# Patient Record
Sex: Female | Born: 1980 | Race: Black or African American | Hispanic: No | Marital: Married | Smoking: Never smoker
Health system: Southern US, Community
[De-identification: ages and names within clinical notes are randomized; demographics above are authoritative.]

## PROBLEM LIST (undated history)

## (undated) ENCOUNTER — Inpatient Hospital Stay (HOSPITAL_COMMUNITY): Payer: Self-pay

## (undated) DIAGNOSIS — R51 Headache: Secondary | ICD-10-CM

## (undated) DIAGNOSIS — O26872 Cervical shortening, second trimester: Secondary | ICD-10-CM

## (undated) DIAGNOSIS — I10 Essential (primary) hypertension: Secondary | ICD-10-CM

## (undated) DIAGNOSIS — O3432 Maternal care for cervical incompetence, second trimester: Secondary | ICD-10-CM

## (undated) DIAGNOSIS — Z5189 Encounter for other specified aftercare: Secondary | ICD-10-CM

## (undated) HISTORY — DX: Headache: R51

## (undated) HISTORY — DX: Encounter for other specified aftercare: Z51.89

## (undated) HISTORY — PX: NO PAST SURGERIES: SHX2092

## (undated) HISTORY — DX: Essential (primary) hypertension: I10

---
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
## 2003-10-12 ENCOUNTER — Emergency Department (HOSPITAL_COMMUNITY): Admission: EM | Admit: 2003-10-12 | Discharge: 2003-10-12 | Payer: Self-pay | Admitting: Emergency Medicine

## 2005-03-23 ENCOUNTER — Emergency Department (HOSPITAL_COMMUNITY): Admission: EM | Admit: 2005-03-23 | Discharge: 2005-03-23 | Payer: Self-pay | Admitting: Emergency Medicine

## 2006-08-16 ENCOUNTER — Ambulatory Visit: Payer: Self-pay | Admitting: Gynecology

## 2008-01-16 ENCOUNTER — Observation Stay (HOSPITAL_COMMUNITY): Admission: AD | Admit: 2008-01-16 | Discharge: 2008-01-17 | Payer: Self-pay | Admitting: Obstetrics and Gynecology

## 2009-02-28 ENCOUNTER — Emergency Department (HOSPITAL_BASED_OUTPATIENT_CLINIC_OR_DEPARTMENT_OTHER): Admission: EM | Admit: 2009-02-28 | Discharge: 2009-03-01 | Payer: Self-pay | Admitting: Emergency Medicine

## 2009-03-01 ENCOUNTER — Ambulatory Visit: Payer: Self-pay | Admitting: Diagnostic Radiology

## 2009-03-04 ENCOUNTER — Inpatient Hospital Stay (HOSPITAL_COMMUNITY): Admission: AD | Admit: 2009-03-04 | Discharge: 2009-03-04 | Payer: Self-pay | Admitting: Obstetrics and Gynecology

## 2010-06-12 IMAGING — CT CT ABDOMEN W/ CM
2 of 4 series · 16 of 46 positions shown, 18 images · IV contrast (APPLIED)
Comparison: None.

CT ABDOMEN

CLINICAL DATA: Abdominal pain.  Nausea and vomiting.

CT ABDOMEN AND PELVIS WITH CONTRAST  03/01/2009:
TECHNIQUE: Multidetector CT imaging of the abdomen and pelvis was
performed using the standard protocol following bolus
administration of intravenous contrast.
Contrast: 100 ml [id number] IV.  Water as oral contrast.

[Series 3: abd/pelvis 5.0 b31f · axial · 0.71mm/px · z∈[-452,-7]mm · 13 of 99 slices shown, 15 images]
[im 5/99  soft-tissue]
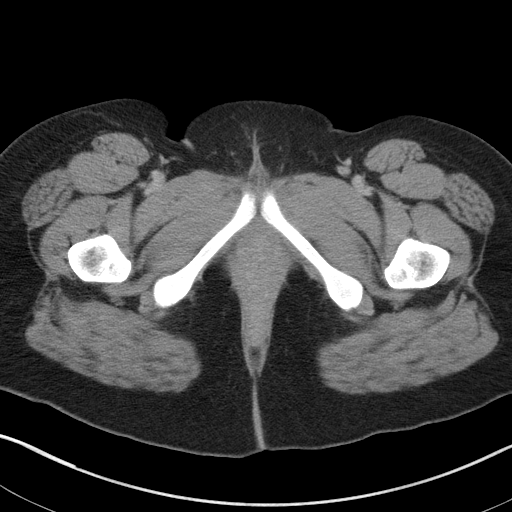
[im 5/99  bone]
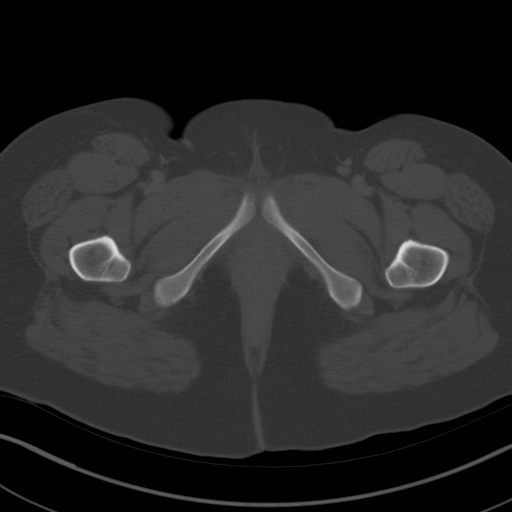
[im 13/99  soft-tissue]
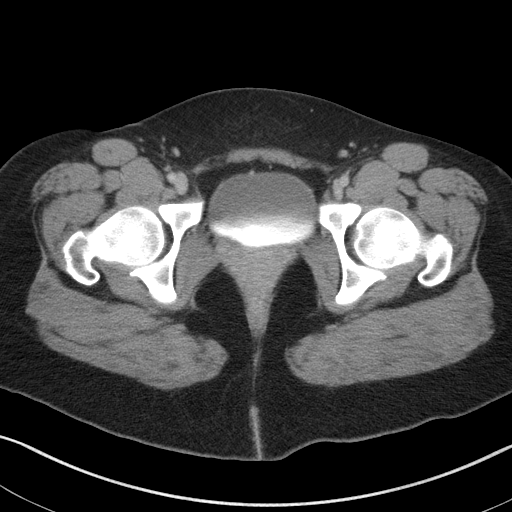
[im 22/99  soft-tissue]
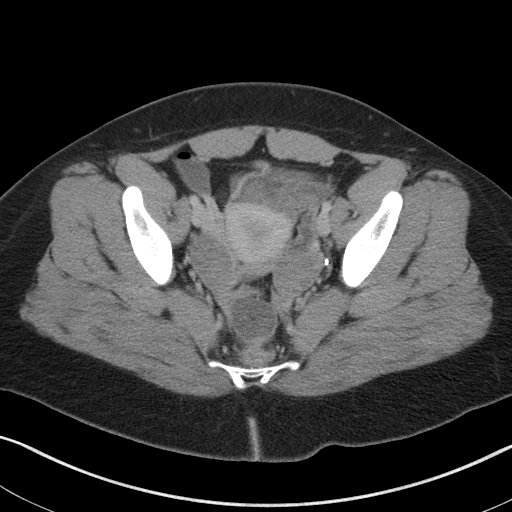
[im 26/99  soft-tissue]
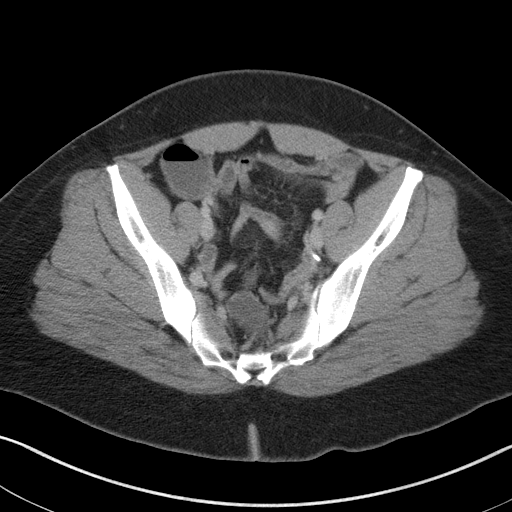
[im 35/99  soft-tissue]
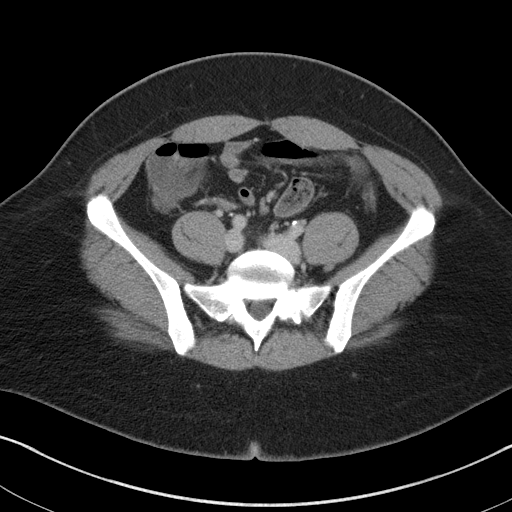
[im 43/99  soft-tissue]
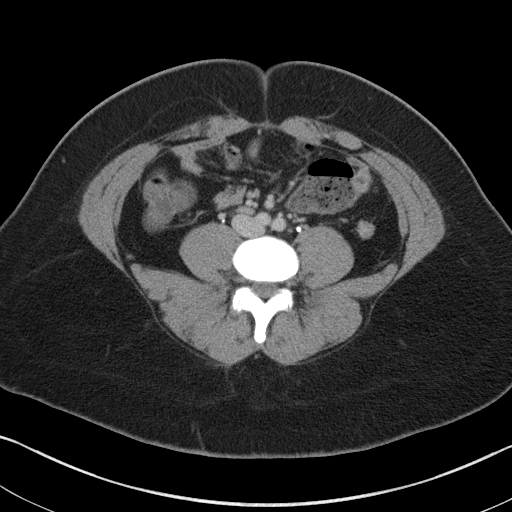
[im 52/99  soft-tissue]
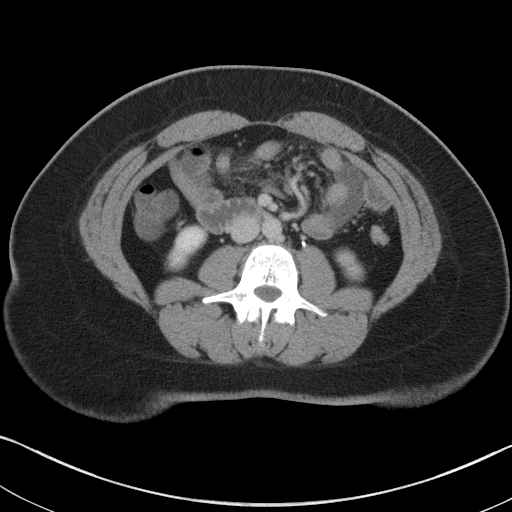
[im 56/99  soft-tissue]
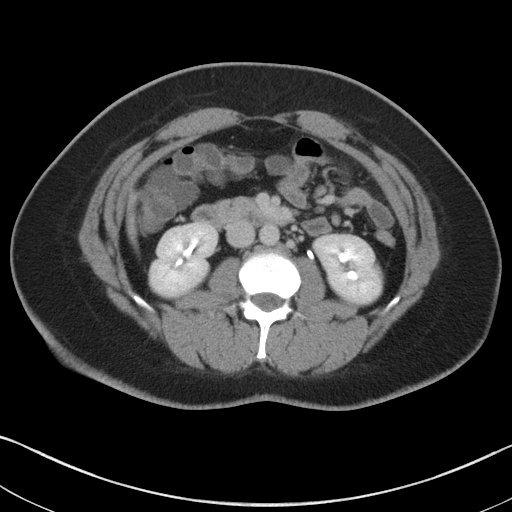
[im 64/99  soft-tissue]
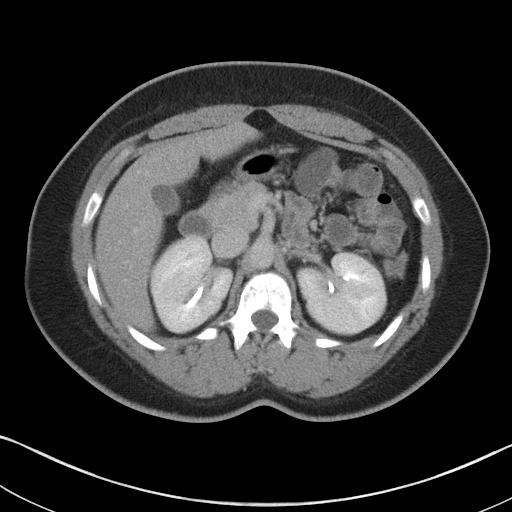
[im 64/99  bone]
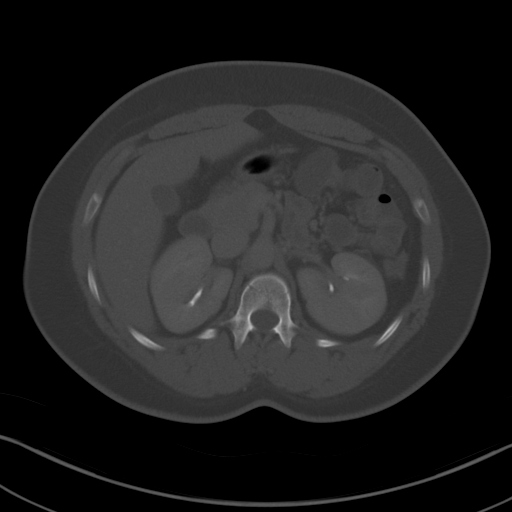
[im 73/99  soft-tissue]
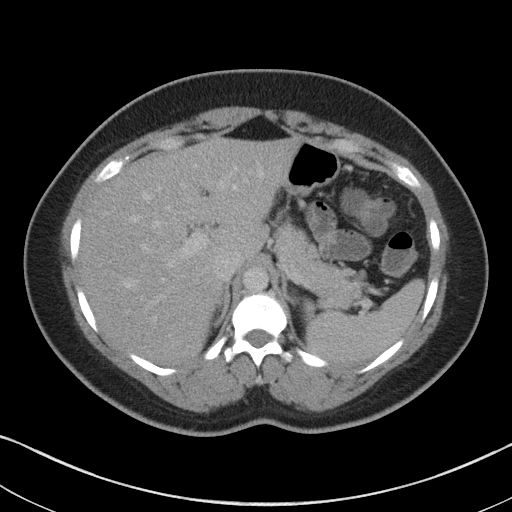
[im 77/99  soft-tissue]
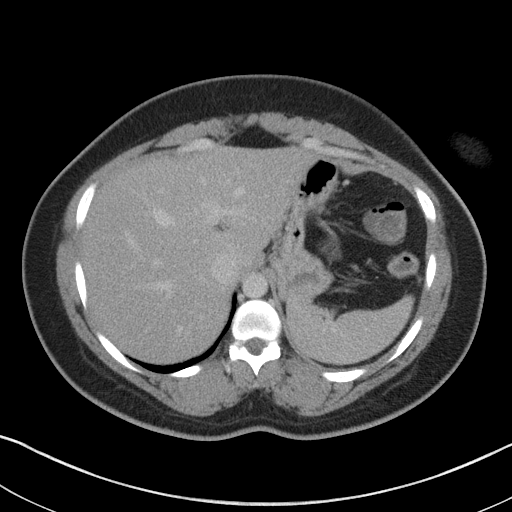
[im 86/99  soft-tissue]
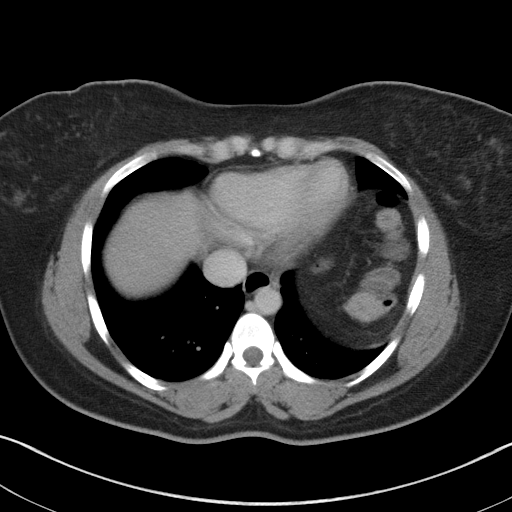
[im 94/99  soft-tissue]
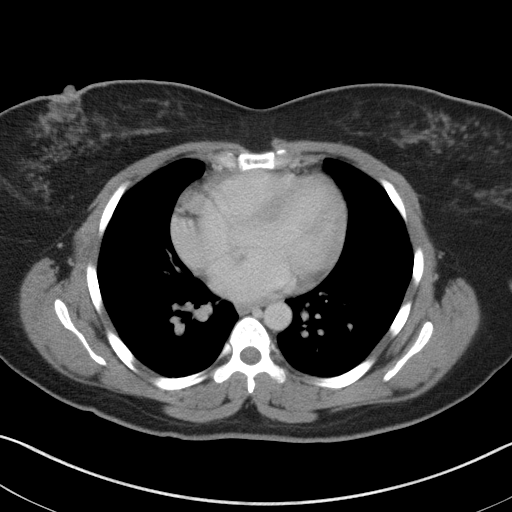

[Series 6: abd/pelvis 3.0 coronal · coronal · 0.72mm/px · 3 of 69 slices shown]
[im 23/69  soft-tissue]
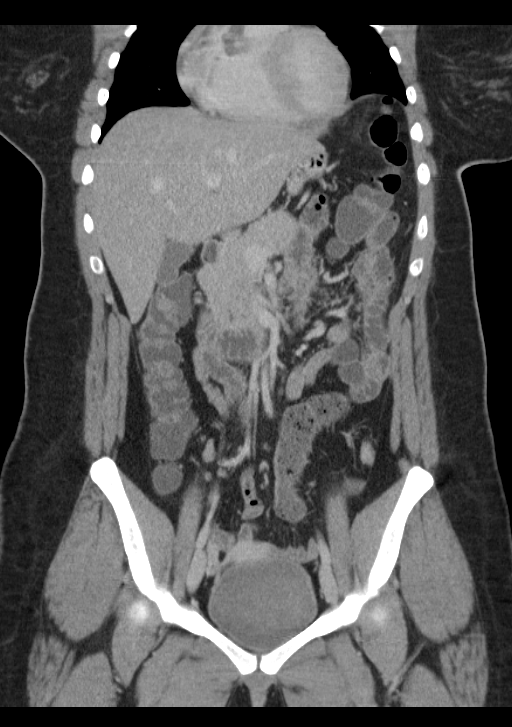
[im 31/69  soft-tissue]
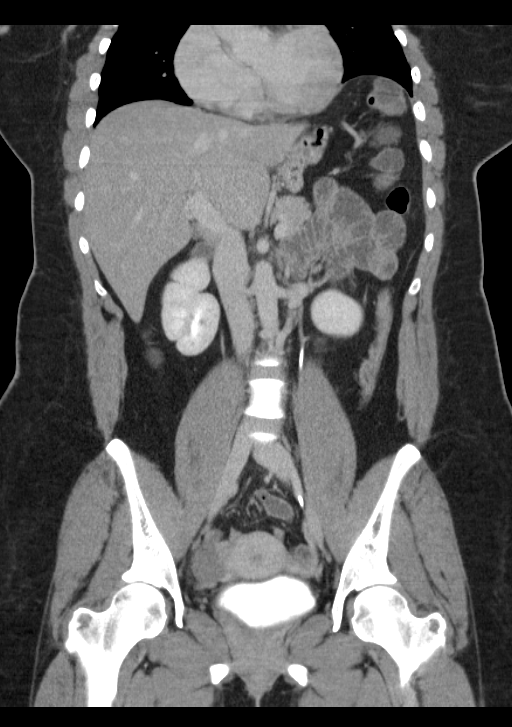
[im 38/69  soft-tissue]
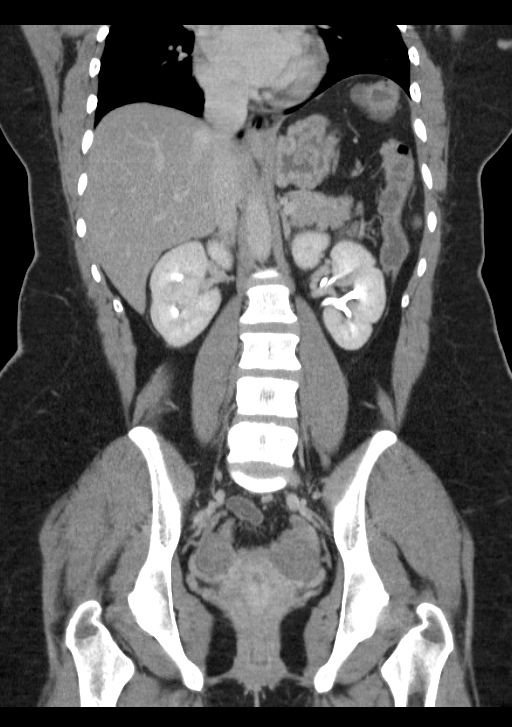

[16 of 46 positions shown; findings below may reference images not displayed]

FINDINGS: Normal appearing liver, spleen, pancreas, adrenal
glands, and right kidney.  Approximate 1 cm cyst in the upper pole
of the left kidney and bifid left renal pelvis; no significant
abnormalities involving the left kidney.  Gallbladder unremarkable
by CT.  No biliary ductal dilation.  Stomach and visualized small
bowel and colon unremarkable; the fundus of the stomach is
decompressed.  Normal appearing abdominal aorta.  No significant
lymphadenopathy.  No ascites.  Mild elevation of the left
hemidiaphragm.  Visualized lung bases clear.  Bone window images
unremarkable.
IMPRESSION: No acute or significant abnormalities in the abdomen.

CT PELVIS
FINDINGS: Normal appearing uterus and ovaries for age; numerous
bilateral ovarian follicular cysts.  Visualized colon and small
bowel unremarkable.  Urinary bladder unremarkable.  No significant
lymphadenopathy.  No ascites.  Bone window images unremarkable.
IMPRESSION: Normal CT of the pelvis.

## 2010-12-19 LAB — COMPREHENSIVE METABOLIC PANEL
ALT: 27 U/L (ref 0–35)
ALT: 22 U/L (ref 0–35)
AST: 28 U/L (ref 0–37)
AST: 37 U/L (ref 0–37)
Alkaline Phosphatase: 69 U/L (ref 39–117)
Alkaline Phosphatase: 94 U/L (ref 39–117)
BUN: 12 mg/dL (ref 6–23)
CO2: 23 mEq/L (ref 19–32)
Calcium: 10 mg/dL (ref 8.4–10.5)
Chloride: 102 mEq/L (ref 96–112)
Glucose, Bld: 101 mg/dL — ABNORMAL HIGH (ref 70–99)
Glucose, Bld: 162 mg/dL — ABNORMAL HIGH (ref 70–99)
Potassium: 4.1 mEq/L (ref 3.5–5.1)
Total Bilirubin: 0.8 mg/dL (ref 0.3–1.2)
Total Bilirubin: 0.5 mg/dL (ref 0.3–1.2)
Total Protein: 7.5 g/dL (ref 6.0–8.3)

## 2010-12-19 LAB — URINE MICROSCOPIC-ADD ON

## 2010-12-19 LAB — CBC
HCT: 37.7 % (ref 36.0–46.0)
HCT: 41.8 % (ref 36.0–46.0)
Hemoglobin: 12.5 g/dL (ref 12.0–15.0)
Hemoglobin: 13.2 g/dL (ref 12.0–15.0)
MCV: 73 fL — ABNORMAL LOW (ref 78.0–100.0)
MCV: 74.4 fL — ABNORMAL LOW (ref 78.0–100.0)
Platelets: 276 10*3/uL (ref 150–400)
Platelets: 340 10*3/uL (ref 150–400)
RBC: 5.16 MIL/uL — ABNORMAL HIGH (ref 3.87–5.11)
RBC: 5.61 MIL/uL — ABNORMAL HIGH (ref 3.87–5.11)
RDW: 16.2 % — ABNORMAL HIGH (ref 11.5–15.5)
WBC: 7.2 10*3/uL (ref 4.0–10.5)

## 2010-12-19 LAB — WET PREP, GENITAL: Clue Cells Wet Prep HPF POC: NONE SEEN

## 2010-12-19 LAB — GC/CHLAMYDIA PROBE AMP, GENITAL
Chlamydia, DNA Probe: NEGATIVE
GC Probe Amp, Genital: NEGATIVE

## 2010-12-19 LAB — URINALYSIS, ROUTINE W REFLEX MICROSCOPIC
Bilirubin Urine: NEGATIVE
Ketones, ur: 40 mg/dL — AB
Ketones, ur: 15 mg/dL — AB
Protein, ur: NEGATIVE mg/dL
Protein, ur: NEGATIVE mg/dL
Urobilinogen, UA: 0.2 mg/dL (ref 0.0–1.0)

## 2010-12-19 LAB — HERPES SIMPLEX VIRUS CULTURE

## 2010-12-19 LAB — DIFFERENTIAL
Basophils Relative: 0 % (ref 0–1)
Basophils Relative: 0 % (ref 0–1)
Eosinophils Absolute: 0 10*3/uL (ref 0.0–0.7)
Lymphs Abs: 0.7 10*3/uL (ref 0.7–4.0)
Monocytes Relative: 12 % (ref 3–12)
Neutro Abs: 5.7 10*3/uL (ref 1.7–7.7)
Neutro Abs: 11.1 10*3/uL — ABNORMAL HIGH (ref 1.7–7.7)
Neutrophils Relative %: 82 % — ABNORMAL HIGH (ref 43–77)

## 2010-12-19 LAB — POCT PREGNANCY, URINE: Preg Test, Ur: NEGATIVE

## 2010-12-19 LAB — LIPASE, BLOOD: Lipase: 94 U/L (ref 23–300)

## 2011-01-27 NOTE — Group Therapy Note (Signed)
NAMEBRAYLYNN, Katelyn Snow NO.:  [id number]   MEDICAL RECORD NO.:  [id number]          PATIENT TYPE:  WOC   LOCATION:  [facility]                   FACILITY:  [facility]   PHYSICIAN:  Ginger Carne, MD DATE OF BIRTH:  1981-04-13   DATE OF SERVICE:                                  CLINIC NOTE   The patient is here today by referral from the health department because  of irregular menses with a presumptive diagnosis of polycystic ovarian  syndrome.  The patient has had a history of acne since her adolescent  years.  However, over the past 2 to 3 years, she has complained of  irregular menses.  The patient weighs 170 pounds and is 5 feet 1 inches,  and admits that she needs to lose 30 to 40 pounds.  She has used birth  control pills in the past, which have been beneficial in controlling her  menses.  She has also been on Provera withdrawal as needed.  She denies  hirsutism.   VITAL SIGNS:  Per record.   HISTORY:  Per office record.   SALIENT PHYSICAL FINDINGS:  No evidence of hirsutism.  Facial acne  noted.  EXTERNAL GENITALIA:  Vulva and vagina normal.  Cervix smooth without  erosions or lesions.  Uterus small, anteverted, and flexed, and both  adnexa palpable, found to be normal.   IMPRESSION:  Anovulatory cycles, probably related to polycystic ovarian  syndrome.   PLAN:  The patient's acne is unrelated to her irregular menses, as this  has occurred prior to said change.  In addition, her oral contraceptives  have not helped said acne in the past.  We discussed life-style changes,  including losing weight.  I am unconvinced that any specific testing  regarding testosterone levels and/or  17 hydroxyprogesterone levels are  in need at this time.  This is late onset and, at this time, the patient  does respond to oral contraceptives.  I suggested that, after her weight  loss of 30 to 40 pounds she would get off of the pill and then see if  she has regular menses.   If not, at that point, she can consider  specific testing as needed, including insulin resistance, 17  hydroxyprogesterone levels, although it is unlikely at this point that  they will be of help in the near future.  The patient does have oral  contraceptives prescribed by the health department.           ______________________________  Ginger Carne, MD     SHB/MEDQ  D:  08/16/2006  T:  08/16/2006  Job:  [id number]

## 2011-03-10 ENCOUNTER — Other Ambulatory Visit (HOSPITAL_COMMUNITY)
Admission: RE | Admit: 2011-03-10 | Discharge: 2011-03-10 | Disposition: A | Discharge status: Home / Self Care | Payer: BC Managed Care – PPO | Source: Ambulatory Visit | Attending: Family Medicine | Admitting: Family Medicine

## 2011-03-10 DIAGNOSIS — Z0000 Encounter for general adult medical examination without abnormal findings: Secondary | ICD-10-CM | POA: Insufficient documentation

## 2012-03-29 ENCOUNTER — Other Ambulatory Visit (HOSPITAL_COMMUNITY)
Admission: RE | Admit: 2012-03-29 | Discharge: 2012-03-29 | Disposition: A | Discharge status: Home / Self Care | Payer: BC Managed Care – PPO | Source: Ambulatory Visit | Attending: Family Medicine | Admitting: Family Medicine

## 2012-03-29 DIAGNOSIS — Z0000 Encounter for general adult medical examination without abnormal findings: Secondary | ICD-10-CM | POA: Insufficient documentation

## 2013-03-04 ENCOUNTER — Ambulatory Visit
Admission: RE | Admit: 2013-03-04 | Discharge: 2013-03-04 | Disposition: A | Discharge status: Home / Self Care | Payer: BC Managed Care – PPO | Source: Ambulatory Visit | Attending: Family Medicine | Admitting: Family Medicine

## 2013-03-04 ENCOUNTER — Other Ambulatory Visit: Payer: Self-pay | Admitting: Family Medicine

## 2013-03-04 DIAGNOSIS — R1031 Right lower quadrant pain: Secondary | ICD-10-CM

## 2013-03-04 DIAGNOSIS — R109 Unspecified abdominal pain: Secondary | ICD-10-CM

## 2013-03-04 MED ORDER — IOHEXOL 300 MG/ML  SOLN
100.0000 mL | Freq: Once | INTRAMUSCULAR | Status: AC | PRN
  Administered 2013-03-04: 100 mL via INTRAVENOUS

## 2013-03-05 ENCOUNTER — Emergency Department (HOSPITAL_COMMUNITY)
Admission: EM | Admit: 2013-03-05 | Discharge: 2013-03-05 | Disposition: A | Discharge status: Home / Self Care | Payer: BC Managed Care – PPO | Attending: Emergency Medicine | Admitting: Emergency Medicine

## 2013-03-05 ENCOUNTER — Encounter (HOSPITAL_COMMUNITY): Payer: Self-pay | Admitting: *Deleted

## 2013-03-05 DIAGNOSIS — K55059 Acute (reversible) ischemia of intestine, part and extent unspecified: Secondary | ICD-10-CM | POA: Insufficient documentation

## 2013-03-05 DIAGNOSIS — R109 Unspecified abdominal pain: Secondary | ICD-10-CM

## 2013-03-05 DIAGNOSIS — K55069 Acute infarction of intestine, part and extent unspecified: Secondary | ICD-10-CM

## 2013-03-05 DIAGNOSIS — R1031 Right lower quadrant pain: Secondary | ICD-10-CM | POA: Insufficient documentation

## 2013-03-05 DIAGNOSIS — K5900 Constipation, unspecified: Secondary | ICD-10-CM

## 2013-03-05 LAB — URINALYSIS, ROUTINE W REFLEX MICROSCOPIC
Bilirubin Urine: NEGATIVE
Nitrite: NEGATIVE
Specific Gravity, Urine: 1.02 (ref 1.005–1.030)
Urobilinogen, UA: 0.2 mg/dL (ref 0.0–1.0)

## 2013-03-05 LAB — BASIC METABOLIC PANEL
CO2: 27 mEq/L (ref 19–32)
Calcium: 9 mg/dL (ref 8.4–10.5)
Creatinine, Ser: 0.85 mg/dL (ref 0.50–1.10)
Glucose, Bld: 120 mg/dL — ABNORMAL HIGH (ref 70–99)

## 2013-03-05 LAB — CBC WITH DIFFERENTIAL/PLATELET
Eosinophils Relative: 2 % (ref 0–5)
HCT: 34.3 % — ABNORMAL LOW (ref 36.0–46.0)
Lymphocytes Relative: 41 % (ref 12–46)
Lymphs Abs: 2 10*3/uL (ref 0.7–4.0)
MCV: 71.6 fL — ABNORMAL LOW (ref 78.0–100.0)
Monocytes Absolute: 0.4 10*3/uL (ref 0.1–1.0)
RBC: 4.79 MIL/uL (ref 3.87–5.11)
WBC: 5 10*3/uL (ref 4.0–10.5)

## 2013-03-05 MED ORDER — DSS 100 MG PO CAPS: 100.0000 mg | ORAL_CAPSULE | Freq: Two times a day (BID) | ORAL | Status: DC

## 2013-03-05 MED ORDER — DICYCLOMINE HCL 10 MG/ML IM SOLN
20.0000 mg | Freq: Once | INTRAMUSCULAR | Status: AC
  Administered 2013-03-05: 20 mg via INTRAMUSCULAR
  Filled 2013-03-05: qty 2

## 2013-03-05 MED ORDER — OXYCODONE-ACETAMINOPHEN 5-325 MG PO TABS: 2.0000 | ORAL_TABLET | ORAL | Status: DC | PRN

## 2013-03-05 MED ORDER — POLYETHYLENE GLYCOL 3350 17 GM/SCOOP PO POWD: 17.0000 g | Freq: Every day | ORAL | Status: DC

## 2013-03-05 MED ORDER — ONDANSETRON 4 MG PO TBDP: 4.0000 mg | ORAL_TABLET | Freq: Three times a day (TID) | ORAL | Status: DC | PRN

## 2013-03-05 MED ORDER — POLYETHYLENE GLYCOL 3350 17 GM/SCOOP PO POWD
1.0000 | Freq: Once | ORAL | Status: AC
  Administered 2013-03-05: 1 via ORAL
  Filled 2013-03-05 (×3): qty 255

## 2013-03-05 MED ORDER — DICYCLOMINE HCL 20 MG PO TABS: 20.0000 mg | ORAL_TABLET | Freq: Four times a day (QID) | ORAL | Status: DC | PRN

## 2013-03-05 MED ORDER — OXYCODONE-ACETAMINOPHEN 5-325 MG PO TABS
2.0000 | ORAL_TABLET | Freq: Once | ORAL | Status: AC
  Administered 2013-03-05: 2 via ORAL
  Filled 2013-03-05: qty 2

## 2013-03-05 MED ORDER — DOCUSATE SODIUM 100 MG PO CAPS
100.0000 mg | ORAL_CAPSULE | Freq: Two times a day (BID) | ORAL | Status: DC
  Administered 2013-03-05: 100 mg via ORAL
  Filled 2013-03-05 (×2): qty 1

## 2013-03-05 NOTE — ED Notes (Signed)
Pt states she has been having abd pain since Saturday.  Pt states her PCP stated that her pain was not from appendicitis, but that she had "backed up stool" and a possible twist in her small intestines.  Pt states she had a CT scan done through [facility].

## 2013-03-05 NOTE — ED Provider Notes (Signed)
History    CSN: [id number] Arrival date & time 03/05/13  0865  First MD Initiated Contact with Patient 03/05/13 0700     Chief Complaint  Patient presents with  . Abdominal Pain   (Consider location/radiation/quality/duration/timing/severity/associated sxs/prior Treatment) HPI -year-old female presents to emergency room with complaint of right lower quadrant pain since Saturday.  She was seen by her primary care doctor yesterday who ordered a pregnancy test, and a CAT scan.  She reports she was told the CAT scan, showed that she had a twist in her intestines and constipation.  She was given Vicodin, she has taken 2 doses without improvement in pain.  She reports the pain was worse on Saturday, but has been steadily worsening after being better for a day or so.  No fevers no chills.  Patient reports she is having regular bowel movements.  She denies any urinary symptoms.  No vaginal discharge.  No prior surgeries.  History reviewed. No pertinent past medical history. History reviewed. No pertinent past surgical history. History reviewed. No pertinent family history. History  Substance Use Topics  . Smoking status: Never Smoker   . Smokeless tobacco: Not on file  . Alcohol Use: Yes     Comment: socially   OB History   Grav Para Term Preterm Abortions TAB SAB Ect Mult Living   0              Review of Systems  All other systems reviewed and are negative.      Allergies  Review of patient's allergies indicates no known allergies.  Home Medications   Current Outpatient Rx  Name  Route  Sig  Dispense  Refill  . HYDROcodone-acetaminophen (VICODIN) 5-500 MG per tablet   Oral   Take 1 tablet by mouth every 6 (six) hours as needed for pain.          BP 133/85  Pulse 85  Temp(Src) 97.8 F (36.6 C) (Oral)  Resp 16  SpO2 99%  LMP 01/28/2013 Physical Exam  Nursing note and vitals reviewed. Constitutional: She is oriented to person, place, and time. She appears  well-developed and well-nourished.  HENT:  Head: Normocephalic and atraumatic.  Nose: Nose normal.  Mouth/Throat: Oropharynx is clear and moist.  Eyes: Conjunctivae and EOM are normal. Pupils are equal, round, and reactive to light.  Neck: Normal range of motion. Neck supple. No JVD present. No tracheal deviation present. No thyromegaly present.  Cardiovascular: Normal rate, regular rhythm, normal heart sounds and intact distal pulses.  Exam reveals no gallop and no friction rub.   No murmur heard. Pulmonary/Chest: Effort normal and breath sounds normal. No stridor. No respiratory distress. She has no wheezes. She has no rales. She exhibits no tenderness.  Abdominal: Soft. Bowel sounds are normal. She exhibits no distension and no mass. There is tenderness (tenderness in right lower quadrant with rebound, but no guarding). There is no rebound and no guarding.  Musculoskeletal: Normal range of motion. She exhibits no edema and no tenderness.  Lymphadenopathy:    She has no cervical adenopathy.  Neurological: She is alert and oriented to person, place, and time. She exhibits normal muscle tone. Coordination normal.  Skin: Skin is warm and dry. No rash noted. No erythema. No pallor.  Psychiatric: She has a normal mood and affect. Her behavior is normal. Judgment and thought content normal.    ED Course  Procedures (including critical care time) Labs Reviewed  WET PREP, GENITAL  GC/CHLAMYDIA PROBE AMP  CBC WITH DIFFERENTIAL  BASIC METABOLIC PANEL  URINALYSIS, ROUTINE W REFLEX MICROSCOPIC   Ct Abdomen Pelvis W Contrast  03/04/2013   *RADIOLOGY REPORT*  Clinical Data: Right lower quadrant pain.  Rule out appendicitis. Symptoms started 3 days ago.  CT ABDOMEN AND PELVIS WITH CONTRAST  Technique:  Multidetector CT imaging of the abdomen and pelvis was performed following the standard protocol during bolus administration of intravenous contrast.  Contrast: OMNIPAQUE IOHEXOL 300 MG/ML  SOLN   Comparison: 03/01/2009  Findings: Lung bases:  Clear lung bases.  Mild cardiomegaly, without pericardial or pleural effusion.  Abdomen/pelvis:  Mild hepatic steatosis, without focal liver lesion.  Normal spleen, stomach, pancreas, gallbladder, biliary tract, adrenal glands.  Too small to characterize interpolar left renal lesion.  Normal right kidney.  No retroperitoneal or retrocrural adenopathy.  Colonic stool burden suggests constipation.  Normal terminal ileum.  Appendix not confidently identified.  Subtle increased density is identified within the right lower quadrant greater omentum on image 49/series 2.  Normal small bowel without abdominal ascites.  No pelvic adenopathy.    Normal urinary bladder and uterus.  No adnexal mass.  No significant free fluid.  Bones/Musculoskeletal:  No acute osseous abnormality.  IMPRESSION:  1.  Subtle edema within the right lower quadrant greater omentum. Suspicious for omental infarct. 2. Possible constipation. 3. Although the appendix is not confidently visualized, no evidence of appendicitis is seen. 4.  Mild hepatic steatosis.   Original Report Authenticated By: Jeronimo Greaves, M.D.   1. Abdominal pain   2. Omental infarction   3. Constipation     MDM  32 year old female with right lower quadrant pain.  CT scan shows normal terminal ileum, but appendix not identified, with possible omental infarct.  We'll check labs, pelvic exam, and treat for pain.  9:08 AM D/w Dr Wynelle Link, on call for Dr Azucena Cecil.  Pt has appt today at 1030, will cancel that.  Workup here unremarkable, patient feeling better.  Will switch to percocet, give stool softners.  Pt given precautions for return, and needs recheck by pcm in 24-48 hours.  Olivia Mackie, MD 03/05/13 [phone]

## 2013-03-06 LAB — GC/CHLAMYDIA PROBE AMP
CT Probe RNA: NEGATIVE
GC Probe RNA: NEGATIVE

## 2013-06-18 ENCOUNTER — Other Ambulatory Visit (HOSPITAL_COMMUNITY)
Admission: RE | Admit: 2013-06-18 | Discharge: 2013-06-18 | Disposition: A | Discharge status: Home / Self Care | Payer: BC Managed Care – PPO | Source: Ambulatory Visit | Attending: Family Medicine | Admitting: Family Medicine

## 2013-06-18 ENCOUNTER — Other Ambulatory Visit: Payer: Self-pay | Admitting: Family Medicine

## 2013-06-18 DIAGNOSIS — Z01419 Encounter for gynecological examination (general) (routine) without abnormal findings: Secondary | ICD-10-CM | POA: Insufficient documentation

## 2014-03-22 ENCOUNTER — Encounter (HOSPITAL_COMMUNITY): Payer: Self-pay | Admitting: Emergency Medicine

## 2014-03-22 ENCOUNTER — Emergency Department (HOSPITAL_COMMUNITY)
Admission: EM | Admit: 2014-03-22 | Discharge: 2014-03-23 | Disposition: A | Discharge status: Home / Self Care | Payer: BC Managed Care – PPO | Attending: Emergency Medicine | Admitting: Emergency Medicine

## 2014-03-22 DIAGNOSIS — Z3202 Encounter for pregnancy test, result negative: Secondary | ICD-10-CM | POA: Insufficient documentation

## 2014-03-22 DIAGNOSIS — R51 Headache: Secondary | ICD-10-CM

## 2014-03-22 DIAGNOSIS — R197 Diarrhea, unspecified: Secondary | ICD-10-CM | POA: Insufficient documentation

## 2014-03-22 NOTE — ED Notes (Signed)
Pt c/o frontal HA x 2 weeks, + diarrhea. Denies n/v.

## 2014-03-23 LAB — URINALYSIS, ROUTINE W REFLEX MICROSCOPIC
BILIRUBIN URINE: NEGATIVE
GLUCOSE, UA: NEGATIVE mg/dL
Hgb urine dipstick: NEGATIVE
Ketones, ur: NEGATIVE mg/dL
LEUKOCYTES UA: NEGATIVE
NITRITE: NEGATIVE
PH: 5.5 (ref 5.0–8.0)
Protein, ur: NEGATIVE mg/dL
SPECIFIC GRAVITY, URINE: 1.018 (ref 1.005–1.030)
Urobilinogen, UA: 0.2 mg/dL (ref 0.0–1.0)

## 2014-03-23 LAB — PREGNANCY, URINE: Preg Test, Ur: NEGATIVE

## 2014-03-23 MED ORDER — ONDANSETRON HCL 4 MG PO TABS: 4.0000 mg | ORAL_TABLET | Freq: Four times a day (QID) | ORAL | Status: DC

## 2014-03-23 MED ORDER — IBUPROFEN 800 MG PO TABS: 800.0000 mg | ORAL_TABLET | Freq: Three times a day (TID) | ORAL | Status: DC

## 2014-03-23 MED ORDER — METOCLOPRAMIDE HCL 5 MG/ML IJ SOLN
10.0000 mg | Freq: Once | INTRAMUSCULAR | Status: AC
  Administered 2014-03-23: 10 mg via INTRAVENOUS
  Filled 2014-03-23: qty 2

## 2014-03-23 MED ORDER — DIPHENHYDRAMINE HCL 50 MG/ML IJ SOLN
25.0000 mg | Freq: Once | INTRAMUSCULAR | Status: AC
  Administered 2014-03-23: 25 mg via INTRAVENOUS
  Filled 2014-03-23: qty 1

## 2014-03-23 MED ORDER — SODIUM CHLORIDE 0.9 % IV BOLUS (SEPSIS)
1000.0000 mL | Freq: Once | INTRAVENOUS | Status: AC
  Administered 2014-03-23: 1000 mL via INTRAVENOUS

## 2014-03-23 MED ORDER — DEXAMETHASONE SODIUM PHOSPHATE 10 MG/ML IJ SOLN
10.0000 mg | Freq: Once | INTRAMUSCULAR | Status: AC
  Administered 2014-03-23: 10 mg via INTRAVENOUS
  Filled 2014-03-23: qty 1

## 2014-03-23 MED ORDER — PROMETHAZINE HCL 25 MG/ML IJ SOLN
25.0000 mg | Freq: Once | INTRAMUSCULAR | Status: DC
  Filled 2014-03-23: qty 1

## 2014-03-23 MED ORDER — KETOROLAC TROMETHAMINE 15 MG/ML IJ SOLN
15.0000 mg | Freq: Once | INTRAMUSCULAR | Status: AC
  Administered 2014-03-23: 15 mg via INTRAVENOUS
  Filled 2014-03-23: qty 1

## 2014-03-23 MED ORDER — ONDANSETRON HCL 4 MG/2ML IJ SOLN
4.0000 mg | Freq: Once | INTRAMUSCULAR | Status: AC
  Administered 2014-03-23: 4 mg via INTRAVENOUS
  Filled 2014-03-23: qty 2

## 2014-03-23 NOTE — Discharge Instructions (Signed)
Call for a follow up appointment with a Family or Primary Care Provider.  °Return if Symptoms worsen.   °Take medication as prescribed.  ° °

## 2014-03-23 NOTE — ED Provider Notes (Signed)
CSN: [id number]     Arrival date & time 03/22/14  2155 History   First MD Initiated Contact with Patient 03/23/14 0037     Chief Complaint  Patient presents with  . Headache     (Consider location/radiation/quality/duration/timing/severity/associated sxs/prior Treatment) HPI Comments: The patient is a 33 year old female presenting to the ED with a chief complaint of frontal headache for 2 weeks. She reports intermittent headache , Frontal in nature. She reports gradual onset today not relieved with ibuprofen or Tylenol. She reports increase in discomfort with walking up stairs. Relieved with rest. Denies fever, chills, neck pain, abdominal pain, nausea, vomiting.  She reports 2 episodes of loose stool today. PCP: [facility] Patient's last menstrual period was 03/05/2014.  Patient is a 33 y.o. female presenting with headaches. The history is provided by the patient. No language interpreter was used.  Headache Associated symptoms: diarrhea   Associated symptoms: no abdominal pain, no fever, no nausea, no neck pain, no numbness, no photophobia and no vomiting     History reviewed. No pertinent past medical history. History reviewed. No pertinent past surgical history. No family history on file. History  Substance Use Topics  . Smoking status: Never Smoker   . Smokeless tobacco: Not on file  . Alcohol Use: Yes     Comment: socially   OB History   Grav Para Term Preterm Abortions TAB SAB Ect Mult Living   0              Review of Systems  Constitutional: Negative for fever and chills.  Eyes: Negative for photophobia and visual disturbance.  Gastrointestinal: Positive for diarrhea. Negative for nausea, vomiting, abdominal pain, constipation and abdominal distention.  Genitourinary: Negative for dysuria.  Musculoskeletal: Negative for neck pain.  Neurological: Positive for headaches. Negative for weakness and numbness.      Allergies  Review of patient's allergies indicates  no known allergies.  Home Medications   Prior to Admission medications   Medication Sig Start Date End Date Taking? Authorizing Provider  acetaminophen (TYLENOL) 500 MG tablet Take 500 mg by mouth every 6 (six) hours as needed for mild pain.   Yes Historical Provider, MD  ibuprofen (ADVIL,MOTRIN) 600 MG tablet Take 600 mg by mouth every 6 (six) hours as needed for moderate pain.   Yes Historical Provider, MD   BP 143/100  Pulse 77  Temp(Src) 98.3 F (36.8 C) (Oral)  Resp 20  Ht 5\' 2"  (1.575 m)  Wt 195 lb (88.451 kg)  BMI 35.66 kg/m2  SpO2 99%  LMP 03/08/2014 Physical Exam  Nursing note and vitals reviewed. Constitutional: She is oriented to person, place, and time. She appears well-developed and well-nourished.  Non-toxic appearance. She does not have a sickly appearance. She does not appear ill. No distress.  HENT:  Head: Normocephalic and atraumatic.  Mouth/Throat: Uvula is midline, oropharynx is clear and moist and mucous membranes are normal.  Eyes: EOM are normal. Pupils are equal, round, and reactive to light. Right eye exhibits no discharge. Left eye exhibits no discharge. No scleral icterus.  Neck: Normal range of motion. Neck supple.  Cardiovascular: Normal rate and regular rhythm.   No murmur heard. No lower extremity edema  Pulmonary/Chest: Effort normal and breath sounds normal. She has no wheezes. She has no rales. She exhibits no tenderness.  Abdominal: Soft. Bowel sounds are normal. She exhibits no distension. There is no tenderness. There is no rebound, no guarding and no CVA tenderness.  Musculoskeletal: Normal range of  motion. She exhibits no edema.  Neurological: She is alert and oriented to person, place, and time.  Speech is clear and goal oriented, follows commands Cranial nerves III - XII grossly intact, no facial droop Normal strength in upper and lower extremities bilaterally, strong and equal grip strength Sensation normal to light touch Moves all 4  extremities without ataxia, coordination intact  Skin: Skin is warm and dry. No rash noted. She is not diaphoretic.  Psychiatric: She has a normal mood and affect. Her behavior is normal. Thought content normal.    ED Course  Procedures (including critical care time) Labs Review Labs Reviewed  URINALYSIS, ROUTINE W REFLEX MICROSCOPIC  PREGNANCY, URINE    Imaging Review No results found.   EKG Interpretation None      MDM   Final diagnoses:  Headache(784.0)  Patient presents with headache, no neurologic deficits on exam. Negative pregnancy. 0240 reevaluation patient sleeping in room. Reports mild resolution of symptoms with medication. Will give second round of headache medication. Re-evaluation patient reports resolution of headache, reports nausea. Requesting something to eat. Meds given in ED:  Medications  ketorolac (TORADOL) 15 MG/ML injection 15 mg (15 mg Intravenous Given 03/23/14 0203)  sodium chloride 0.9 % bolus 1,000 mL (0 mLs Intravenous Stopped 03/23/14 0332)  ondansetron (ZOFRAN) injection 4 mg (4 mg Intravenous Given 03/23/14 0203)  metoCLOPramide (REGLAN) injection 10 mg (10 mg Intravenous Given 03/23/14 0307)  diphenhydrAMINE (BENADRYL) injection 25 mg (25 mg Intravenous Given 03/23/14 0307)  dexamethasone (DECADRON) injection 10 mg (10 mg Intravenous Given 03/23/14 0307)    Discharge Medication List as of 03/23/2014  4:37 AM    START taking these medications   Details  ibuprofen (ADVIL,MOTRIN) 800 MG tablet Take 1 tablet (800 mg total) by mouth 3 (three) times daily., Starting 03/23/2014, Until Discontinued, Print    ondansetron (ZOFRAN) 4 MG tablet Take 1 tablet (4 mg total) by mouth every 6 (six) hours., Starting 03/23/2014, Until Discontinued, Print            Clabe SealLauren M Hartleigh Edmonston, PA-C 03/24/14 [phone]

## 2014-03-23 NOTE — ED Notes (Signed)
Patient c/o mild nausea EDPA at bedside Phenergan ordered Patient asking, "Can I just have something to eat instead?" Patient given Saltine crackers and ice water Patient stated that she would rather try crackers first before giving Phenergan

## 2014-03-23 NOTE — ED Notes (Signed)
PA at bedside.

## 2014-03-25 NOTE — ED Provider Notes (Signed)
Medical screening examination/treatment/procedure(s) were performed by non-physician practitioner and as supervising physician I was immediately available for consultation/collaboration.   EKG Interpretation None        Katelyn Snow [place], MD 03/25/14 2237 

## 2014-04-10 ENCOUNTER — Encounter: Payer: Self-pay | Admitting: Neurology

## 2014-04-10 ENCOUNTER — Ambulatory Visit (INDEPENDENT_AMBULATORY_CARE_PROVIDER_SITE_OTHER): Payer: BC Managed Care – PPO | Admitting: Neurology

## 2014-04-10 VITALS — BP 140/90 | HR 88 | Temp 98.7°F | Resp 16 | Ht 62.0 in | Wt 197.8 lb

## 2014-04-10 DIAGNOSIS — R51 Headache: Secondary | ICD-10-CM

## 2014-04-10 DIAGNOSIS — R519 Headache, unspecified: Secondary | ICD-10-CM

## 2014-04-10 NOTE — Progress Notes (Signed)
NEUROLOGY CONSULTATION NOTE  Katelyn Snow MRN: [id number] DOB: 04-Feb-1981  Referring provider: Dr. Wynelle Link Primary care provider: Dr. Wynelle Link  Reason for consult:  [place]  HISTORY OF PRESENT ILLNESS: Katelyn Snow is a 33 year old right-handed woman with irregular menses presents for headache.  The notes mentioned asthma, but she says she does not have asthma.  Records reviewed.  Onset:  Late June 2015 Location:  Bi-frontal/temporal and both sides of neck Quality:  pounding Intensity:  Usually 6-7/10 (11/10 when she went to ED) Aura:  No  But often wakes up with them Prodrome:  no Associated symptoms:  None.  No nausea, photophobia, phonophobia, osmophobia or visual disturbance Duration:  1 day (20 minutes with Excedrin migraine) Frequency:  4 days per week Triggers/exacerbating factors:  Dehydration, not wearing glasses Relieving factors:  Excedrin migraine Activity:  Able to function  Past abortive therapy:  Tylenol (helped initially) Past preventative therapy:  none  Current abortive therapy:  Excedrin migraine Current preventative therapy:  none  She [facility] ED on 03/22/14 regarding this headache.  She was given a cocktail injection of Toradol 15mg  and Zofran 4mg  and later Decadron 10mg , Reglan 10mg  and Benadryl 25mg .  Caffeine:  Coffee (cut down) Alcohol:  occasionally Smoker:  no Diet:  Drinking more water. Exercise:  Not routine Depression/stress:  Mild stress Sleep hygiene:  good Family history of headache:  No.  No family history of aneurysms.  PAST MEDICAL HISTORY: Past Medical History  Diagnosis Date  . Headache(784.0)     PAST SURGICAL HISTORY: No past surgical history on file.  MEDICATIONS: Current Outpatient Prescriptions on File Prior to Visit  Medication Sig Dispense Refill  . acetaminophen (TYLENOL) 500 MG tablet Take 500 mg by mouth every 6 (six) hours as needed for mild pain.      Marland Kitchen ibuprofen (ADVIL,MOTRIN) 800 MG tablet  Take 1 tablet (800 mg total) by mouth 3 (three) times daily.  21 tablet  0  . ondansetron (ZOFRAN) 4 MG tablet Take 1 tablet (4 mg total) by mouth every 6 (six) hours.  12 tablet  0   No current facility-administered medications on file prior to visit.    ALLERGIES: No Known Allergies  FAMILY HISTORY: Family History  Problem Relation Age of Onset  . Hypertension Mother   . Hypertension Father   . Hypertension Maternal Grandmother   . Diabetes Maternal Grandmother     SOCIAL HISTORY: History   Social History  . Marital Status: Single    Spouse Name: N/A    Number of Children: N/A  . Years of Education: N/A   Occupational History  . Not on file.   Social History Main Topics  . Smoking status: Never Smoker   . Smokeless tobacco: Not on file  . Alcohol Use: Yes     Comment: socially  . Drug Use: No  . Sexual Activity: Yes    Partners: Male   Other Topics Concern  . Not on file   Social History Narrative  . No narrative on file    REVIEW OF SYSTEMS: Constitutional: No fevers, chills, or sweats, no generalized fatigue, change in appetite Eyes: No visual changes, double vision, eye pain Ear, nose and throat: No hearing loss, ear pain, nasal congestion, sore throat Cardiovascular: No chest pain, palpitations Respiratory:  No shortness of breath at rest or with exertion, wheezes GastrointestinaI: No nausea, vomiting, diarrhea, abdominal pain, fecal incontinence Genitourinary:  No dysuria, urinary retention or frequency Musculoskeletal:  No  neck pain, back pain Integumentary: No rash, pruritus, skin lesions Neurological: as above Psychiatric: No depression, insomnia, anxiety Endocrine: No palpitations, fatigue, diaphoresis, mood swings, change in appetite, change in weight, increased thirst Hematologic/Lymphatic:  No anemia, purpura, petechiae. Allergic/Immunologic: no itchy/runny eyes, nasal congestion, recent allergic reactions, rashes  PHYSICAL EXAM: Filed  Vitals:   04/10/14 1352  BP: 140/90  Pulse: 88  Temp: 98.7 F (37.1 C)  Resp: 16   General: No acute distress Head:  Normocephalic/atraumatic Neck: supple, no paraspinal tenderness, full range of motion Back: No paraspinal tenderness Heart: regular rate and rhythm Lungs: Clear to auscultation bilaterally. Vascular: No carotid bruits. Neurological Exam: Mental status: alert and oriented to person, place, and time, recent and remote memory intact, fund of knowledge intact, attention and concentration intact, speech fluent and not dysarthric, language intact. Cranial nerves: CN I: not tested CN II: pupils equal, round and reactive to light, visual fields intact, fundi unremarkable, without vessel changes, exudates, hemorrhages or papilledema. CN III, IV, VI:  full range of motion, no nystagmus, no ptosis CN V: facial sensation intact CN VII: upper and lower face symmetric CN VIII: hearing intact CN IX, X: gag intact, uvula midline CN XI: sternocleidomastoid and trapezius muscles intact CN XII: tongue midline Bulk & Tone: normal, no fasciculations. Motor:5/5 throughout Sensation: temperature and vibration intact Deep Tendon Reflexes: 2+ throughout, toes downgoing Finger to nose testing: no dysmetria Heel to shin: no dysmetria Gait: normal station and stride.  Able to turn and walk in tandem. Romberg negative.  IMPRESSION: New onset headache, probable migraine  PLAN: 1.  Continue Excedrin migraine for abortive therapy, but instructed not to exceed more than 2 days out of the week. 2.  Since it has only been a month and headaches are starting to improve, monitor over the next month.  If headaches still frequent, will initiate preventative (she would like to try propranolol). 3.  MRI of the brain with and without contrast.  She says she may be pregnant.  I advised to find out for sure and let us know before we schedule MRI. 4.  Follow up in 2 months.  Thank you for allowing me to  take part in the care of this patient.  Shon MilletAdam Kadynce Bonds, DO  CC:  Deatra JamesVyvyan Sun, MD

## 2014-04-10 NOTE — Patient Instructions (Addendum)
1.  Monitor over the next month to see if the headaches resolve.  If they persist, we will have to start a preventative medication (propranolol).  Call in 4 weeks with update 2.  I would like to get an MRI of the head.  Check to see if you are pregnant and then give us a call back. 3.  Take Excedrin migraine for the headaches, but limit use to no more than 2 days out of the week. 4.  Follow up in 2 months.

## 2014-04-28 ENCOUNTER — Other Ambulatory Visit: Payer: Self-pay | Admitting: *Deleted

## 2014-04-28 ENCOUNTER — Telehealth: Payer: Self-pay | Admitting: *Deleted

## 2014-04-28 ENCOUNTER — Telehealth: Payer: Self-pay | Admitting: Neurology

## 2014-04-28 DIAGNOSIS — R519 Headache, unspecified: Secondary | ICD-10-CM

## 2014-04-28 DIAGNOSIS — R51 Headache: Principal | ICD-10-CM

## 2014-04-28 NOTE — Telephone Encounter (Signed)
Pt test results are neg and needs to be set up for MRI  [phone]

## 2014-04-28 NOTE — Telephone Encounter (Signed)
[facility] is [facility] of MRI [facility]  05/12/14 12:45

## 2014-05-12 ENCOUNTER — Ambulatory Visit (HOSPITAL_COMMUNITY)
Admission: RE | Admit: 2014-05-12 | Discharge: 2014-05-12 | Disposition: A | Discharge status: Home / Self Care | Payer: BC Managed Care – PPO | Source: Ambulatory Visit | Attending: Neurology | Admitting: Neurology

## 2014-05-12 DIAGNOSIS — R519 Headache, unspecified: Secondary | ICD-10-CM

## 2014-05-12 DIAGNOSIS — E236 Other disorders of pituitary gland: Secondary | ICD-10-CM | POA: Diagnosis not present

## 2014-05-12 DIAGNOSIS — R51 Headache: Secondary | ICD-10-CM | POA: Diagnosis not present

## 2014-05-12 MED ORDER — GADOBENATE DIMEGLUMINE 529 MG/ML IV SOLN
20.0000 mL | Freq: Once | INTRAVENOUS | Status: AC | PRN
  Administered 2014-05-12: 19 mL via INTRAVENOUS

## 2014-05-13 ENCOUNTER — Telehealth: Payer: Self-pay | Admitting: *Deleted

## 2014-05-13 NOTE — Telephone Encounter (Signed)
Patient is aware of MRI exam and was advised to have her eyes checked  She states she had her eyes checked in July

## 2014-05-13 NOTE — Telephone Encounter (Signed)
Message copied by Fredirick Maudlin on Wed May 13, 2014 10:02 AM ------      Message from: JAFFE, ADAM R      Created: Wed May 13, 2014  8:50 AM       MRI looks unremarkable.  It may be a good idea that she gets her eyes checked too. ------

## 2014-06-24 ENCOUNTER — Other Ambulatory Visit: Payer: Self-pay | Admitting: Family Medicine

## 2014-06-24 ENCOUNTER — Other Ambulatory Visit (HOSPITAL_COMMUNITY)
Admission: RE | Admit: 2014-06-24 | Discharge: 2014-06-24 | Disposition: A | Discharge status: Home / Self Care | Payer: BC Managed Care – PPO | Source: Ambulatory Visit | Attending: Family Medicine | Admitting: Family Medicine

## 2014-06-24 DIAGNOSIS — Z0000 Encounter for general adult medical examination without abnormal findings: Secondary | ICD-10-CM | POA: Insufficient documentation

## 2014-06-25 LAB — CYTOLOGY - PAP

## 2015-05-28 ENCOUNTER — Ambulatory Visit (INDEPENDENT_AMBULATORY_CARE_PROVIDER_SITE_OTHER): Payer: BLUE CROSS/BLUE SHIELD | Admitting: Family Medicine

## 2015-05-28 VITALS — BP 132/78 | HR 113 | Temp 99.5°F | Resp 18 | Ht 62.0 in | Wt 199.0 lb

## 2015-05-28 DIAGNOSIS — J019 Acute sinusitis, unspecified: Secondary | ICD-10-CM

## 2015-05-28 MED ORDER — AMOXICILLIN 500 MG PO CAPS: 1000.0000 mg | ORAL_CAPSULE | Freq: Three times a day (TID) | ORAL | Status: DC

## 2015-05-28 MED ORDER — HYDROCOD POLST-CPM POLST ER 10-8 MG/5ML PO SUER: 5.0000 mL | Freq: Two times a day (BID) | ORAL | Status: DC | PRN

## 2015-05-28 MED ORDER — IPRATROPIUM BROMIDE 0.03 % NA SOLN: 2.0000 | Freq: Four times a day (QID) | NASAL | Status: DC

## 2015-05-28 NOTE — Progress Notes (Signed)
Subjective:    Patient ID: Rush Landmark, female    DOB: Aug 29, 1981, 34 y.o.   MRN: [id number] This chart was scribed for Norberto Sorenson, MD by Jolene Provost, Medical Scribe. This patient was seen in Room 9 and the patient's care was started a 4:13 PM.  Chief Complaint  Patient presents with  . Cough    dry x2 days   . sinus pressure    since monday   . Nasal Congestion  . Sore Throat  . Headache    HPI HPI Comments: Angeliz Settlemyre is a 34 y.o. female who presents to [facility] complaining of cough and sinus congestion for the last four days with associated chills, sore throat, wheezing, and HA. She denies SOB, nausea, vomiting, or dehydration. She states she initially developed sinus congestion and sinus pain, followed two days later with severe cough, sore throat, and HA. She has used nyquil, sudafed, tylenol sinus, and mucinex without relief. She sates she has been urinating normally.   Past Medical History  Diagnosis Date  . Headache(784.0)   . Blood transfusion without reported diagnosis   . Hypertension    No Known Allergies Current Outpatient Prescriptions on File Prior to Visit  Medication Sig Dispense Refill  . aspirin-acetaminophen-caffeine (EXCEDRIN MIGRAINE) 250-250-65 MG per tablet Take 2 tablets by mouth every 6 (six) hours as needed for headache.     No current facility-administered medications on file prior to visit.    Review of Systems  Constitutional: Positive for chills. Negative for fever.  HENT: Positive for congestion, rhinorrhea, sinus pressure and sore throat.   Respiratory: Positive for cough and wheezing. Negative for shortness of breath.   Gastrointestinal: Negative for nausea and vomiting.  Neurological: Positive for headaches.      Objective:   Physical Exam  Constitutional: She is oriented to person, place, and time. She appears well-developed and well-nourished. No distress.  HENT:  Head: Normocephalic and atraumatic.  Left TM with mild  effusion, right TM retracted. Throat with mild erythema.  Eyes: Pupils are equal, round, and reactive to light.  Neck: Neck supple.  Some mild submandibular adenopathy. No supraclavicular adenopathy. Thyroid normal.   Cardiovascular: Normal rate.   Tachycardic with regular rythym. Normal s1 S2. 1/6 slow murmer RUSB.   Pulmonary/Chest: Effort normal. No respiratory distress.  Decreased breath sounds throughout with a few dispersed, isolated rhonchi, not repetitive.   Musculoskeletal: Normal range of motion.  Neurological: She is alert and oriented to person, place, and time. Coordination normal.  Skin: Skin is warm and dry. She is not diaphoretic.  Psychiatric: She has a normal mood and affect. Her behavior is normal.  Nursing note and vitals reviewed.  Filed Vitals:   05/28/15 1600  BP: 132/78  Pulse: 113  Temp: 99.5 F (37.5 C)  TempSrc: Oral  Resp: 18  Height:  (1.575 m)  Weight: 199 lb (90.266 kg)  SpO2: 99%       Assessment & Plan:   1. Acute sinusitis, recurrence not specified, unspecified location   push fluids due to tachycardia and new flow murmur - recheck within 5d to ensure resolved - sooner if worse.  Meds ordered this encounter  Medications  . chlorpheniramine-HYDROcodone (TUSSIONEX PENNKINETIC ER) 10-8 MG/5ML SUER    Sig: Take 5 mLs by mouth every 12 (twelve) hours as needed for cough.    Dispense:  90 mL    Refill:  0  . amoxicillin (AMOXIL) 500 MG capsule    Sig: Take 2  capsules (1,000 mg total) by mouth 3 (three) times daily.    Dispense:  60 capsule    Refill:  0  . ipratropium (ATROVENT) 0.03 % nasal spray    Sig: Place 2 sprays into the nose 4 (four) times daily.    Dispense:  30 mL    Refill:  1    I personally performed the services described in this documentation, which was scribed in my presence. The recorded information has been reviewed and considered, and addended by me as needed.  Norberto Sorenson, MD MPH

## 2015-05-28 NOTE — Patient Instructions (Signed)
From your exam and your vital signs I suspect you are dehydrated so you need to double the amount of fluids that you are drinking. I know you say you are drinking plenty and keeping your urine light yellow in which case you should definitely recheck with me within 5d to ensure your rapid heart rate and heart murmur - which I am attributing to dehydration from your acute illness - have resolved and don't need further evaluation.  Dehydration, Adult Dehydration is when you lose more fluids from the body than you take in. Vital organs like the kidneys, brain, and heart cannot function without a proper amount of fluids and salt. Any loss of fluids from the body can cause dehydration.  CAUSES   Vomiting.  Diarrhea.  Excessive sweating.  Excessive urine output.  Fever. SYMPTOMS  Mild dehydration  Thirst.  Dry lips.  Slightly dry mouth. Moderate dehydration  Very dry mouth.  Sunken eyes.  Skin does not bounce back quickly when lightly pinched and released.  Dark urine and decreased urine production.  Decreased tear production.  Headache. Severe dehydration  Very dry mouth.  Extreme thirst.  Rapid, weak pulse (more than 100 beats per minute at rest).  Cold hands and feet.  Not able to sweat in spite of heat and temperature.  Rapid breathing.  Blue lips.  Confusion and lethargy.  Difficulty being awakened.  Minimal urine production.  No tears. DIAGNOSIS  Your caregiver will diagnose dehydration based on your symptoms and your exam. Blood and urine tests will help confirm the diagnosis. The diagnostic evaluation should also identify the cause of dehydration. TREATMENT  Treatment of mild or moderate dehydration can often be done at home by increasing the amount of fluids that you drink. It is best to drink small amounts of fluid more often. Drinking too much at one time can make vomiting worse. Refer to the home care instructions below. Severe dehydration needs to  be treated at the hospital where you will probably be given intravenous (IV) fluids that contain water and electrolytes. HOME CARE INSTRUCTIONS   Ask your caregiver about specific rehydration instructions.  Drink enough fluids to keep your urine clear or pale yellow.  Drink small amounts frequently if you have nausea and vomiting.  Eat as you normally do.  Avoid:  Foods or drinks high in sugar.  Carbonated drinks.  Juice.  Extremely hot or cold fluids.  Drinks with caffeine.  Fatty, greasy foods.  Alcohol.  Tobacco.  Overeating.  Gelatin desserts.  Wash your hands well to avoid spreading bacteria and viruses.  Only take over-the-counter or prescription medicines for pain, discomfort, or fever as directed by your caregiver.  Ask your caregiver if you should continue all prescribed and over-the-counter medicines.  Keep all follow-up appointments with your caregiver. SEEK MEDICAL CARE IF:  You have abdominal pain and it increases or stays in one area (localizes).  You have a rash, stiff neck, or severe headache.  You are irritable, sleepy, or difficult to awaken.  You are weak, dizzy, or extremely thirsty. SEEK IMMEDIATE MEDICAL CARE IF:   You are unable to keep fluids down or you get worse despite treatment.  You have frequent episodes of vomiting or diarrhea.  You have blood or green matter (bile) in your vomit.  You have blood in your stool or your stool looks black and tarry.  You have not urinated in 6 to 8 hours, or you have only urinated a small amount of very dark urine.  You have a fever.  You faint. MAKE SURE YOU:   Understand these instructions.  Will watch your condition.  Will get help right away if you are not doing well or get worse. Document Released: 08/28/2005 Document Revised: 11/20/2011 Document Reviewed: 04/17/2011 [facility] Patient Information 2015 [place], [place]. This information is not intended to replace advice given to you  by your health care provider. Make sure you discuss any questions you have with your health care provider. Sinusitis Sinusitis is redness, soreness, and inflammation of the paranasal sinuses. Paranasal sinuses are air pockets within the bones of your face (beneath the eyes, the middle of the forehead, or above the eyes). In healthy paranasal sinuses, mucus is able to drain out, and air is able to circulate through them by way of your nose. However, when your paranasal sinuses are inflamed, mucus and air can become trapped. This can allow bacteria and other germs to grow and cause infection. Sinusitis can develop quickly and last only a short time (acute) or continue over a long period (chronic). Sinusitis that lasts for more than 12 weeks is considered chronic.  CAUSES  Causes of sinusitis include:  Allergies.  Structural abnormalities, such as displacement of the cartilage that separates your nostrils (deviated septum), which can decrease the air flow through your nose and sinuses and affect sinus drainage.  Functional abnormalities, such as when the small hairs (cilia) that line your sinuses and help remove mucus do not work properly or are not present. SIGNS AND SYMPTOMS  Symptoms of acute and chronic sinusitis are the same. The primary symptoms are pain and pressure around the affected sinuses. Other symptoms include:  Upper toothache.  Earache.  Headache.  Bad breath.  Decreased sense of smell and taste.  A cough, which worsens when you are lying flat.  Fatigue.  Fever.  Thick drainage from your nose, which often is green and may contain pus (purulent).  Swelling and warmth over the affected sinuses. DIAGNOSIS  Your health care provider will perform a physical exam. During the exam, your health care provider may:  Look in your nose for signs of abnormal growths in your nostrils (nasal polyps).  Tap over the affected sinus to check for signs of infection.  View the inside  of your sinuses (endoscopy) using an imaging device that has a light attached (endoscope). If your health care provider suspects that you have chronic sinusitis, one or more of the following tests may be recommended:  Allergy tests.  Nasal culture. A sample of mucus is taken from your nose, sent to a lab, and screened for bacteria.  Nasal cytology. A sample of mucus is taken from your nose and examined by your health care provider to determine if your sinusitis is related to an allergy. TREATMENT  Most cases of acute sinusitis are related to a viral infection and will resolve on their own within 10 days. Sometimes medicines are prescribed to help relieve symptoms (pain medicine, decongestants, nasal steroid sprays, or saline sprays).  However, for sinusitis related to a bacterial infection, your health care provider will prescribe antibiotic medicines. These are medicines that will help kill the bacteria causing the infection.  Rarely, sinusitis is caused by a fungal infection. In theses cases, your health care provider will prescribe antifungal medicine. For some cases of chronic sinusitis, surgery is needed. Generally, these are cases in which sinusitis recurs more than 3 times per year, despite other treatments. HOME CARE INSTRUCTIONS   Drink plenty of water. Water helps  thin the mucus so your sinuses can drain more easily.  Use a humidifier.  Inhale steam 3 to 4 times a day (for example, sit in the bathroom with the shower running).  Apply a warm, moist washcloth to your face 3 to 4 times a day, or as directed by your health care provider.  Use saline nasal sprays to help moisten and clean your sinuses.  Take medicines only as directed by your health care provider.  If you were prescribed either an antibiotic or antifungal medicine, finish it all even if you start to feel better. SEEK IMMEDIATE MEDICAL CARE IF:  You have increasing pain or severe headaches.  You have nausea,  vomiting, or drowsiness.  You have swelling around your face.  You have vision problems.  You have a stiff neck.  You have difficulty breathing. MAKE SURE YOU:   Understand these instructions.  Will watch your condition.  Will get help right away if you are not doing well or get worse. Document Released: 08/28/2005 Document Revised: 01/12/2014 Document Reviewed: 09/12/2011 [facility] Patient Information 2015 [place], [place]. This information is not intended to replace advice given to you by your health care provider. Make sure you discuss any questions you have with your health care provider.

## 2015-06-03 ENCOUNTER — Telehealth: Payer: Self-pay

## 2015-06-03 MED ORDER — FLUCONAZOLE 150 MG PO TABS: 150.0000 mg | ORAL_TABLET | Freq: Once | ORAL | Status: DC

## 2015-06-03 NOTE — Telephone Encounter (Signed)
Is patient needs a prescription for yeast infection sent [facility] on [address]. [phone]

## 2015-06-03 NOTE — Telephone Encounter (Signed)
On Amoxicillin on 9/16.

## 2015-06-03 NOTE — Telephone Encounter (Signed)
Left vmail. Diflucan sent to pharmacy, however, if sx persist should RTC.

## 2016-07-13 ENCOUNTER — Other Ambulatory Visit (HOSPITAL_COMMUNITY)
Admission: RE | Admit: 2016-07-13 | Discharge: 2016-07-13 | Disposition: A | Discharge status: Home / Self Care | Payer: BC Managed Care – PPO | Source: Ambulatory Visit | Attending: Family Medicine | Admitting: Family Medicine

## 2016-07-13 ENCOUNTER — Other Ambulatory Visit: Payer: Self-pay | Admitting: Family Medicine

## 2016-07-13 DIAGNOSIS — Z01411 Encounter for gynecological examination (general) (routine) with abnormal findings: Secondary | ICD-10-CM | POA: Insufficient documentation

## 2016-07-13 DIAGNOSIS — Z1151 Encounter for screening for human papillomavirus (HPV): Secondary | ICD-10-CM | POA: Insufficient documentation

## 2016-07-13 DIAGNOSIS — R87810 Cervical high risk human papillomavirus (HPV) DNA test positive: Secondary | ICD-10-CM | POA: Diagnosis not present

## 2016-07-18 LAB — CYTOLOGY - PAP
Diagnosis: NEGATIVE
HPV 16/18/45 GENOTYPING: NEGATIVE
HPV: DETECTED — AB

## 2016-09-15 ENCOUNTER — Emergency Department (HOSPITAL_COMMUNITY): Payer: BC Managed Care – PPO

## 2016-09-15 ENCOUNTER — Emergency Department (HOSPITAL_COMMUNITY)
Admission: EM | Admit: 2016-09-15 | Discharge: 2016-09-15 | Disposition: A | Discharge status: Home / Self Care | Payer: BC Managed Care – PPO | Attending: Emergency Medicine | Admitting: Emergency Medicine

## 2016-09-15 ENCOUNTER — Encounter (HOSPITAL_COMMUNITY): Payer: Self-pay

## 2016-09-15 DIAGNOSIS — R112 Nausea with vomiting, unspecified: Secondary | ICD-10-CM | POA: Diagnosis present

## 2016-09-15 DIAGNOSIS — Z7982 Long term (current) use of aspirin: Secondary | ICD-10-CM | POA: Diagnosis not present

## 2016-09-15 DIAGNOSIS — I10 Essential (primary) hypertension: Secondary | ICD-10-CM | POA: Diagnosis not present

## 2016-09-15 DIAGNOSIS — R1013 Epigastric pain: Secondary | ICD-10-CM | POA: Diagnosis not present

## 2016-09-15 DIAGNOSIS — R197 Diarrhea, unspecified: Secondary | ICD-10-CM | POA: Insufficient documentation

## 2016-09-15 LAB — COMPREHENSIVE METABOLIC PANEL
ALK PHOS: 56 U/L (ref 38–126)
ALT: 21 U/L (ref 14–54)
AST: 27 U/L (ref 15–41)
Albumin: 3.9 g/dL (ref 3.5–5.0)
Anion gap: 8 (ref 5–15)
BILIRUBIN TOTAL: 1.1 mg/dL (ref 0.3–1.2)
BUN: 10 mg/dL (ref 6–20)
CALCIUM: 9.2 mg/dL (ref 8.9–10.3)
CO2: 24 mmol/L (ref 22–32)
CREATININE: 0.83 mg/dL (ref 0.44–1.00)
Chloride: 105 mmol/L (ref 101–111)
GFR calc Af Amer: >60 mL/min (ref >60)
GFR calc non Af Amer: >60 mL/min (ref >60)
GLUCOSE: 134 mg/dL — AB (ref 65–99)
Potassium: 4.6 mmol/L (ref 3.5–5.1)
Sodium: 137 mmol/L (ref 135–145)
TOTAL PROTEIN: 7.5 g/dL (ref 6.5–8.1)

## 2016-09-15 LAB — I-STAT BETA HCG BLOOD, ED (MC, WL, AP ONLY): I-stat hCG, quantitative: <5 m[IU]/mL (ref <5)

## 2016-09-15 LAB — CBC
HCT: 37.4 % (ref 36.0–46.0)
HEMOGLOBIN: 12.5 g/dL (ref 12.0–15.0)
MCH: 23.9 pg — AB (ref 26.0–34.0)
MCHC: 33.4 g/dL (ref 30.0–36.0)
MCV: 71.5 fL — ABNORMAL LOW (ref 78.0–100.0)
PLATELETS: 303 10*3/uL (ref 150–400)
RBC: 5.23 MIL/uL — ABNORMAL HIGH (ref 3.87–5.11)
RDW: 16.1 % — AB (ref 11.5–15.5)
WBC: 8.8 10*3/uL (ref 4.0–10.5)

## 2016-09-15 LAB — URINALYSIS, ROUTINE W REFLEX MICROSCOPIC
Bilirubin Urine: NEGATIVE
GLUCOSE, UA: NEGATIVE mg/dL
Ketones, ur: NEGATIVE mg/dL
Leukocytes, UA: NEGATIVE
NITRITE: NEGATIVE
PH: 6 (ref 5.0–8.0)
Protein, ur: NEGATIVE mg/dL
SPECIFIC GRAVITY, URINE: 1.016 (ref 1.005–1.030)

## 2016-09-15 LAB — LIPASE, BLOOD: Lipase: 25 U/L (ref 11–51)

## 2016-09-15 MED ORDER — METOCLOPRAMIDE HCL 5 MG/ML IJ SOLN
10.0000 mg | Freq: Once | INTRAMUSCULAR | Status: AC
  Administered 2016-09-15: 10 mg via INTRAVENOUS
  Filled 2016-09-15: qty 2

## 2016-09-15 MED ORDER — ONDANSETRON 8 MG PO TBDP: 8.0000 mg | ORAL_TABLET | Freq: Three times a day (TID) | ORAL | 0 refills | Status: DC | PRN

## 2016-09-15 MED ORDER — IOPAMIDOL (ISOVUE-300) INJECTION 61%
INTRAVENOUS | Status: AC
  Administered 2016-09-15: 100 mL
  Filled 2016-09-15: qty 100

## 2016-09-15 MED ORDER — ONDANSETRON HCL 4 MG/2ML IJ SOLN
4.0000 mg | Freq: Once | INTRAMUSCULAR | Status: AC
  Administered 2016-09-15: 4 mg via INTRAVENOUS
  Filled 2016-09-15: qty 2

## 2016-09-15 MED ORDER — SODIUM CHLORIDE 0.9 % IV BOLUS (SEPSIS)
1000.0000 mL | Freq: Once | INTRAVENOUS | Status: AC
  Administered 2016-09-15: 1000 mL via INTRAVENOUS

## 2016-09-15 MED ORDER — MORPHINE SULFATE (PF) 4 MG/ML IV SOLN
6.0000 mg | Freq: Once | INTRAVENOUS | Status: AC
  Administered 2016-09-15: 6 mg via INTRAVENOUS
  Filled 2016-09-15: qty 2

## 2016-09-15 NOTE — ED Notes (Signed)
Pt family at nurses station stating patient's pain is worse. EDP is aware.

## 2016-09-15 NOTE — Discharge Instructions (Signed)
Take Zofran as prescribed as needed for nausea and vomiting. Drink plenty of fluids. Advance diet as tolerated. Follow-up with family doctor as needed. Return if worsening symptoms

## 2016-09-15 NOTE — ED Triage Notes (Signed)
[facility]- pt here with c/o abd pain that began around 2200 last night. She reports nausea and vomiting. No emesis with EMS.

## 2016-09-15 NOTE — ED Provider Notes (Signed)
[facility]-EMERGENCY DEPT Provider Note   CSN: [id number] Arrival date & time: 09/15/16  0753     History   Chief Complaint Chief Complaint  Patient presents with  . Abdominal Pain    HPI Katelyn Snow is a 36 y.o. female.  HPI Katelyn Snow is a 36 y.o. female history of hypertension, anemia, presents to emergency department complaining of nausea, vomiting, diarrhea, abdominal pain onset this morning. Patient states she ate a large meal last night including enchilada, ice cream, orange juice, other things all mixed together. She states she woke up this morning with abdominal cramping, multiple episodes of nausea, vomiting, diarrhea. She denies any blood in her stool or emesis. She denies any urinary symptoms or back pain. She has not tried taking any medications prior to coming in. No one around her sick with the same.  Past Medical History:  Diagnosis Date  . Blood transfusion without reported diagnosis   . Headache(784.0)   . Hypertension     Patient Active Problem List   Diagnosis Date Noted  . New onset headache 04/10/2014    History reviewed. No pertinent surgical history.  OB History    Gravida Para Term Preterm AB Living   0             SAB TAB Ectopic Multiple Live Births                   Home Medications    Prior to Admission medications   Medication Sig Start Date End Date Taking? Authorizing Provider  amoxicillin (AMOXIL) 500 MG capsule Take 2 capsules (1,000 mg total) by mouth 3 (three) times daily. 05/28/15   Sherren MochaEva N Shaw, MD  aspirin-acetaminophen-caffeine (EXCEDRIN MIGRAINE) [phone] MG per tablet Take 2 tablets by mouth every 6 (six) hours as needed for headache.    Historical Provider, MD  chlorpheniramine-HYDROcodone (TUSSIONEX PENNKINETIC ER) 10-8 MG/5ML SUER Take 5 mLs by mouth every 12 (twelve) hours as needed for cough. 05/28/15   Sherren MochaEva N Shaw, MD  fluconazole (DIFLUCAN) 150 MG tablet Take 1 tablet (150 mg total) by mouth once. Repeat if  needed 06/03/15   Tishira R Brewington, PA-C  ipratropium (ATROVENT) 0.03 % nasal spray Place 2 sprays into the nose 4 (four) times daily. 05/28/15   Sherren MochaEva N Shaw, MD    Family History Family History  Problem Relation Age of Onset  . Hypertension Mother   . Hypertension Father   . Hypertension Maternal Grandmother   . Diabetes Maternal Grandmother     Social History Social History  Substance Use Topics  . Smoking status: Never Smoker  . Smokeless tobacco: Never Used  . Alcohol use 0.6 oz/week    1 Standard drinks or equivalent per week     Comment: socially     Allergies   Patient has no known allergies.   Review of Systems Review of Systems  Constitutional: Negative for chills and fever.  Respiratory: Negative for cough, chest tightness and shortness of breath.   Cardiovascular: Negative for chest pain, palpitations and leg swelling.  Gastrointestinal: Positive for abdominal pain, diarrhea, nausea and vomiting.  Genitourinary: Negative for dysuria, flank pain, pelvic pain, vaginal bleeding, vaginal discharge and vaginal pain.  Musculoskeletal: Negative for arthralgias, myalgias, neck pain and neck stiffness.  Skin: Negative for rash.  Neurological: Negative for dizziness, weakness and headaches.  All other systems reviewed and are negative.    Physical Exam Updated Vital Signs BP 135/93   Pulse 96   Temp 98.5  F (36.9 C) (Oral)   Resp 18   LMP 09/15/2016 (Within Days)   SpO2 100%   Physical Exam  Constitutional: She appears well-developed and well-nourished. No distress.  HENT:  Head: Normocephalic.  Eyes: Conjunctivae are normal.  Neck: Neck supple.  Cardiovascular: Normal rate, regular rhythm and normal heart sounds.   Pulmonary/Chest: Effort normal and breath sounds normal. No respiratory distress. She has no wheezes. She has no rales.  Abdominal: Soft. Bowel sounds are normal. She exhibits no distension. There is tenderness. There is no rebound.    Epigastric tenderness  Musculoskeletal: She exhibits no edema.  Neurological: She is alert.  Skin: Skin is warm and dry.  Psychiatric: She has a normal mood and affect. Her behavior is normal.  Nursing note and vitals reviewed.    ED Treatments / Results  Labs (all labs ordered are listed, but only abnormal results are displayed) Labs Reviewed  COMPREHENSIVE METABOLIC PANEL - Abnormal; Notable for the following:       Result Value   Glucose, Bld 134 (*)    All other components within normal limits  CBC - Abnormal; Notable for the following:    RBC 5.23 (*)    MCV 71.5 (*)    MCH 23.9 (*)    RDW 16.1 (*)    All other components within normal limits  URINALYSIS, ROUTINE W REFLEX MICROSCOPIC - Abnormal; Notable for the following:    Hgb urine dipstick MODERATE (*)    Bacteria, UA RARE (*)    Squamous Epithelial / LPF 0-5 (*)    All other components within normal limits  LIPASE, BLOOD  I-STAT BETA HCG BLOOD, ED ([facility], [facility], [facility] ONLY)    EKG  EKG Interpretation None       Radiology Ct Abdomen Pelvis W Contrast  Result Date: 09/15/2016 CLINICAL DATA:  Lower abdominal pain and vomiting EXAM: CT ABDOMEN AND PELVIS WITH CONTRAST TECHNIQUE: Multidetector CT imaging of the abdomen and pelvis was performed using the standard protocol following bolus administration of intravenous contrast. CONTRAST:  ISOVUE-300 IOPAMIDOL (ISOVUE-300) INJECTION 61% COMPARISON:  03/04/2013 FINDINGS: Lower chest: The lung bases appear clear. No pleural or pericardial effusion. Hepatobiliary: No focal liver abnormality is seen. No gallstones, gallbladder wall thickening, or biliary dilatation. Pancreas: Unremarkable. No pancreatic ductal dilatation or surrounding inflammatory changes. Spleen: Normal in size without focal abnormality. Adrenals/Urinary Tract: Adrenal glands are unremarkable. 6 mm low-attenuation structure within the left kidney is too small to reliably characterize, image number 35 of series  201. No mass or hydronephrosis noted bilaterally. The urinary bladder appears normal. Bladder is unremarkable. Stomach/Bowel: The stomach is normal. There is no pathologic dilatation of the small or large bowel loops. No small or large bowel inflammation. The appendix is not visualized separate from the right lower quadrant bowel loops. Vascular/Lymphatic: Normal appearance of the abdominal aorta. No adenopathy within the abdomen or pelvis. Reproductive: The normal physiologic appearance of the uterus and adnexal structures. Other: No abdominal wall hernia or abnormality. No abdominopelvic ascites. Musculoskeletal: No acute or significant osseous findings. IMPRESSION: 1. No acute findings identified within the abdomen or pelvis. No explanation for abdominal pain, vomiting and diarrhea. Electronically Signed   By: Signa Kell M.D.   On: 09/15/2016 10:46    Procedures Procedures (including critical care time)  Medications Ordered in ED Medications  morphine 4 MG/ML injection 6 mg (6 mg Intravenous Given 09/15/16 0906)  ondansetron (ZOFRAN) injection 4 mg (4 mg Intravenous Given 09/15/16 0904)  sodium chloride  0.9 % bolus 1,000 mL (1,000 mLs Intravenous New Bag/Given 09/15/16 0908)  metoCLOPramide (REGLAN) injection 10 mg (10 mg Intravenous Given 09/15/16 1015)  iopamidol (ISOVUE-300) 61 % injection (100 mLs  Contrast Given 09/15/16 1026)     Initial Impression / Assessment and Plan / ED Course  I have reviewed the triage vital signs and the nursing notes.  Pertinent labs & imaging results that were available during my care of the patient were reviewed by me and considered in my medical decision making (see chart for details).  Clinical Course    Patient in emergency department with acute onset of nausea, vomiting, diarrhea, abdominal pain. Pain is mainly in epigastric area, however patient's crying in pain. No sick contacts. No one family is sick with the same. Will check labs.  No improvement in  pain after Zofran and morphine, nausea is persisting. Labs unremarkable, however patient still crying in pain. Will get CT abdomen and pelvis for further evaluation and try Reglan for nausea and vomiting.   11:08 AM Feeling better. CT normal. Will try PO challenge  11:54 AM Drinking fluids without difficulty. With normal labs and negative CT, most likely viral gastroenteritis. Will dc home with zofran. PCP follow up as needed. Return precautions discussed.   Vitals:   09/15/16 0758 09/15/16 0800 09/15/16 0900 09/15/16 1107  BP: 145/99 144/95 135/93 125/92  Pulse: 95 93 96 96  Resp: 18 16 18 18   Temp: 98.5 F (36.9 C)     TempSrc: Oral     SpO2: 100% 99% 100% 100%     Final Clinical Impressions(s) / ED Diagnoses   Final diagnoses:  Nausea vomiting and diarrhea    New Prescriptions New Prescriptions   ONDANSETRON (ZOFRAN ODT) 8 MG DISINTEGRATING TABLET    Take 1 tablet (8 mg total) by mouth every 8 (eight) hours as needed for nausea or vomiting.     Jaynie Crumble, PA-C 09/15/16 1159    Arby Barrette, MD 09/15/16 1550

## 2017-03-04 ENCOUNTER — Encounter (HOSPITAL_COMMUNITY): Payer: Self-pay

## 2017-03-04 DIAGNOSIS — I10 Essential (primary) hypertension: Secondary | ICD-10-CM | POA: Insufficient documentation

## 2017-03-04 DIAGNOSIS — Z79899 Other long term (current) drug therapy: Secondary | ICD-10-CM | POA: Insufficient documentation

## 2017-03-04 DIAGNOSIS — R1030 Lower abdominal pain, unspecified: Secondary | ICD-10-CM | POA: Insufficient documentation

## 2017-03-04 DIAGNOSIS — Z5321 Procedure and treatment not carried out due to patient leaving prior to being seen by health care provider: Secondary | ICD-10-CM | POA: Insufficient documentation

## 2017-03-04 DIAGNOSIS — Z7982 Long term (current) use of aspirin: Secondary | ICD-10-CM | POA: Insufficient documentation

## 2017-03-04 DIAGNOSIS — N939 Abnormal uterine and vaginal bleeding, unspecified: Secondary | ICD-10-CM | POA: Insufficient documentation

## 2017-03-04 NOTE — ED Triage Notes (Signed)
Pt complaining of lower abdominal pain. Pt states increased cramping with menstration. Pt states on day 5, 3 pads/hour. Pt denies any N/V/D. Pt denies any dysuria.

## 2017-03-05 ENCOUNTER — Emergency Department (HOSPITAL_BASED_OUTPATIENT_CLINIC_OR_DEPARTMENT_OTHER)
Admission: EM | Admit: 2017-03-05 | Discharge: 2017-03-05 | Disposition: A | Discharge status: Home / Self Care | Payer: BLUE CROSS/BLUE SHIELD | Attending: Emergency Medicine | Admitting: Emergency Medicine

## 2017-03-05 ENCOUNTER — Emergency Department (HOSPITAL_COMMUNITY)
Admission: EM | Admit: 2017-03-05 | Discharge: 2017-03-05 | Disposition: A | Discharge status: Home / Self Care | Payer: BC Managed Care – PPO | Attending: Emergency Medicine | Admitting: Emergency Medicine

## 2017-03-05 ENCOUNTER — Encounter (HOSPITAL_BASED_OUTPATIENT_CLINIC_OR_DEPARTMENT_OTHER): Payer: Self-pay | Admitting: Emergency Medicine

## 2017-03-05 DIAGNOSIS — I10 Essential (primary) hypertension: Secondary | ICD-10-CM | POA: Insufficient documentation

## 2017-03-05 DIAGNOSIS — Z79899 Other long term (current) drug therapy: Secondary | ICD-10-CM | POA: Diagnosis not present

## 2017-03-05 DIAGNOSIS — N946 Dysmenorrhea, unspecified: Secondary | ICD-10-CM | POA: Insufficient documentation

## 2017-03-05 DIAGNOSIS — R109 Unspecified abdominal pain: Secondary | ICD-10-CM | POA: Diagnosis present

## 2017-03-05 LAB — GC/CHLAMYDIA PROBE AMP (~~LOC~~) NOT AT ARMC
Chlamydia: NEGATIVE
Neisseria Gonorrhea: NEGATIVE

## 2017-03-05 LAB — I-STAT BETA HCG BLOOD, ED (MC, WL, AP ONLY): I-stat hCG, quantitative: <5 m[IU]/mL

## 2017-03-05 LAB — WET PREP, GENITAL
Sperm: NONE SEEN
Trich, Wet Prep: NONE SEEN
Yeast Wet Prep HPF POC: NONE SEEN

## 2017-03-05 MED ORDER — KETOROLAC TROMETHAMINE 60 MG/2ML IM SOLN
60.0000 mg | Freq: Once | INTRAMUSCULAR | Status: AC
  Administered 2017-03-05: 60 mg via INTRAMUSCULAR
  Filled 2017-03-05: qty 2

## 2017-03-05 NOTE — ED Notes (Signed)
Patient updated on wait time.  States "this is ridiculous" and asks her friend to come with her to leave.  This RN apologized for delays and attempted to explain delays.  Patient states "take me out of the system" and exits through the front doors.

## 2017-03-05 NOTE — ED Triage Notes (Signed)
Pt states she is trying to conceive and took Provera on 6/14 (for 5 days) to bring on her menstrual cycle. Cycle started on 02/28/17 and has been bleeding heavily for the last 3 days with cramps she rates 10/10. Reports soaking 1 tampon and 2 pads "in over an hour".  [facility]ed by [facility].

## 2017-03-05 NOTE — ED Provider Notes (Signed)
[facility] Provider Note   CSN: [id number] Arrival date & time: 03/05/17  0321     History   Chief Complaint Chief Complaint  Patient presents with  . Vaginal Bleeding    HPI Katelyn Snow is a 36 y.o. female.  HPI  36 year old female presents with severe menstrual cramps. She states her menstrual cycle started about 5 days ago. On 6/23 the pain seemed to be worse. She's having heavier bleeding and now blood clots. She was started on Provera by her OB/GYN over the last 1 month because she's she is trying to conceive. Typically has irregular cycles. The lower abdominal pain and low back pain she is experiencing is typical of her menstrual cycles but much more severe. She has taken 500 mg Tylenol without relief. She went to [facility] where she had a negative hCG but due to the weight she left without being seen. She denies any dizziness or lightheadedness. No vomiting. No urinary symptoms or vaginal discharge.  Past Medical History:  Diagnosis Date  . Blood transfusion without reported diagnosis   . Headache(784.0)   . Hypertension     Patient Active Problem List   Diagnosis Date Noted  . New onset headache 04/10/2014    History reviewed. No pertinent surgical history.  OB History    Gravida Para Term Preterm AB Living   0             SAB TAB Ectopic Multiple Live Births                   Home Medications    Prior to Admission medications   Medication Sig Start Date End Date Taking? Authorizing Provider  medroxyPROGESTERone (PROVERA) 5 MG tablet Take 5 mg by mouth daily.   Yes [provider]  amoxicillin (AMOXIL) 500 MG capsule Take 2 capsules (1,000 mg total) by mouth 3 (three) times daily. 05/28/15   Sherren Mocha, MD  aspirin-acetaminophen-caffeine (EXCEDRIN MIGRAINE) [phone] MG per tablet Take 2 tablets by mouth every 6 (six) hours as needed for headache.    [provider]  chlorpheniramine-HYDROcodone (TUSSIONEX PENNKINETIC  ER) 10-8 MG/5ML SUER Take 5 mLs by mouth every 12 (twelve) hours as needed for cough. 05/28/15   Sherren Mocha, MD  fluconazole (DIFLUCAN) 150 MG tablet Take 1 tablet (150 mg total) by mouth once. Repeat if needed 06/03/15   Brewington, Tishira R, PA-C  ipratropium (ATROVENT) 0.03 % nasal spray Place 2 sprays into the nose 4 (four) times daily. 05/28/15   Sherren Mocha, MD  ondansetron (ZOFRAN ODT) 8 MG disintegrating tablet Take 1 tablet (8 mg total) by mouth every 8 (eight) hours as needed for nausea or vomiting. 09/15/16   Jaynie Crumble, PA-C    Family History Family History  Problem Relation Age of Onset  . Hypertension Mother   . Hypertension Father   . Hypertension Maternal Grandmother   . Diabetes Maternal Grandmother     Social History Social History  Substance Use Topics  . Smoking status: Never Smoker  . Smokeless tobacco: Never Used  . Alcohol use 0.6 oz/week    1 Standard drinks or equivalent per week     Comment: socially     Allergies   Patient has no known allergies.   Review of Systems Review of Systems  Constitutional: Negative for fever.  Gastrointestinal: Positive for abdominal pain. Negative for vomiting.  Genitourinary: Positive for vaginal bleeding. Negative for dysuria and vaginal discharge.  Musculoskeletal:  Positive for back pain.  Neurological: Negative for dizziness and light-headedness.  All other systems reviewed and are negative.    Physical Exam Updated Vital Signs BP (!) 137/96 (BP Location: Right Arm)   Pulse 71   Temp 98.7 F (37.1 C) (Oral)   Resp 16   Ht 5\' 2"  (1.575 m)   Wt 90.7 kg (200 lb)   LMP 02/28/2017 (Exact Date)   SpO2 100%   BMI 36.58 kg/m   Physical Exam  Constitutional: She is oriented to person, place, and time. She appears well-developed and well-nourished. No distress.  HENT:  Head: Normocephalic and atraumatic.  Right Ear: External ear normal.  Left Ear: External ear normal.  Nose: Nose normal.  Eyes: Right  eye exhibits no discharge. Left eye exhibits no discharge.  Cardiovascular: Normal rate, regular rhythm and normal heart sounds.   Pulmonary/Chest: Effort normal and breath sounds normal.  Abdominal: Soft. There is tenderness (mild, lower).  Genitourinary: Uterus is tender (mild). Cervix exhibits no discharge. Right adnexum displays no mass. Left adnexum displays no mass. There is bleeding (from the cervical os, mild) in the vagina. No signs of injury around the vagina. No vaginal discharge found.  Neurological: She is alert and oriented to person, place, and time.  Skin: Skin is warm and dry. She is not diaphoretic.  Nursing note and vitals reviewed.    ED Treatments / Results  Labs (all labs ordered are listed, but only abnormal results are displayed) Labs Reviewed  WET PREP, GENITAL - Abnormal; Notable for the following:       Result Value   Clue Cells Wet Prep HPF POC PRESENT (*)    WBC, Wet Prep HPF POC FEW (*)    All other components within normal limits  GC/CHLAMYDIA PROBE AMP ([place]) NOT AT [facility]    EKG  EKG Interpretation None       Radiology No results found.  Procedures Procedures (including critical care time)  Medications Ordered in ED Medications  ketorolac (TORADOL) injection 60 mg (60 mg Intramuscular Given 03/05/17 0435)     Initial Impression / Assessment and Plan / ED Course  I have reviewed the triage vital signs and the nursing notes.  Pertinent labs & imaging results that were available during my care of the patient were reviewed by me and considered in my medical decision making (see chart for details).     Pregnancy test is negative. Exam shows bleeding from the cervical os but otherwise no significant abnormality such as lacerations or abnormal bleeding otherwise. No clots seen. She does not have any signs or symptoms of significant blood loss. She was given IM Toradol for her pain and advised to use NSAIDs as this should specifically help  her dysmenorrhea. No symptomatic vaginal discharge. No urinary symptoms. Follow-up with her OB/GYN. Discussed return precautions.  Final Clinical Impressions(s) / ED Diagnoses   Final diagnoses:  Dysmenorrhea    New Prescriptions New Prescriptions   No medications on file     Pricilla LovelessGoldston, Amrita Radu, MD 03/05/17 [phone]

## 2017-03-05 NOTE — ED Notes (Signed)
Patient's family member updated on wait time.   

## 2017-03-05 NOTE — ED Notes (Signed)
Alert, NAD, calm, interactive, resps e/u, speaking in clear complete sentences, no dyspnea noted, skin W&D, VSS. EDP into room, prior to RN assessment, see MD notes, pending orders. [facility]

## 2017-06-22 ENCOUNTER — Encounter: Payer: Self-pay | Admitting: Family Medicine

## 2017-06-22 ENCOUNTER — Ambulatory Visit (INDEPENDENT_AMBULATORY_CARE_PROVIDER_SITE_OTHER): Payer: BLUE CROSS/BLUE SHIELD | Admitting: Family Medicine

## 2017-06-22 VITALS — BP 128/88 | HR 89 | Temp 98.8°F | Resp 16 | Ht 64.17 in | Wt 204.0 lb

## 2017-06-22 DIAGNOSIS — J029 Acute pharyngitis, unspecified: Secondary | ICD-10-CM | POA: Diagnosis not present

## 2017-06-22 LAB — POCT RAPID STREP A (OFFICE): RAPID STREP A SCREEN: NEGATIVE

## 2017-06-22 MED ORDER — HYDROCODONE-HOMATROPINE 5-1.5 MG/5ML PO SYRP: 5.0000 mL | ORAL_SOLUTION | ORAL | 0 refills | Status: DC | PRN

## 2017-06-22 MED ORDER — AZITHROMYCIN 250 MG PO TABS: ORAL_TABLET | ORAL | 0 refills | Status: DC

## 2017-06-22 NOTE — Patient Instructions (Addendum)
     IF you received an x-ray today, you will receive an invoice from [facility]. Please contact [facility] at [phone] with questions or concerns regarding your invoice.   IF you received labwork today, you will receive an invoice from [facility]. Please contact [facility] at [phone] with questions or concerns regarding your invoice.   Our billing staff will not be able to assist you with questions regarding bills from these companies.  You will be contacted with the lab results as soon as they are available. The fastest way to get your results is to activate your My Chart account. Instructions are located on the last page of this paperwork. If you have not heard from us regarding the results in 2 weeks, please contact this office.    Pharyngitis Pharyngitis is redness, pain, and swelling (inflammation) of your pharynx. What are the causes? Pharyngitis is usually caused by infection. Most of the time, these infections are from viruses (viral) and are part of a cold. However, sometimes pharyngitis is caused by bacteria (bacterial). Pharyngitis can also be caused by allergies. Viral pharyngitis may be spread from person to person by coughing, sneezing, and personal items or utensils (cups, forks, spoons, toothbrushes). Bacterial pharyngitis may be spread from person to person by more intimate contact, such as kissing. What are the signs or symptoms? Symptoms of pharyngitis include:  Sore throat.  Tiredness (fatigue).  Low-grade fever.  Headache.  Joint pain and muscle aches.  Skin rashes.  Swollen lymph nodes.  Plaque-like film on throat or tonsils (often seen with bacterial pharyngitis).  How is this diagnosed? Your health care provider will ask you questions about your illness and your symptoms. Your medical history, along with a physical exam, is often all that is needed to diagnose pharyngitis. Sometimes, a rapid strep test is done. Other lab tests may  also be done, depending on the suspected cause. How is this treated? Viral pharyngitis will usually get better in 3-4 days without the use of medicine. Bacterial pharyngitis is treated with medicines that kill germs (antibiotics). Follow these instructions at home:  Drink enough water and fluids to keep your urine clear or pale yellow.  Only take over-the-counter or prescription medicines as directed by your health care provider: ? If you are prescribed antibiotics, make sure you finish them even if you start to feel better. ? Do not take aspirin.  Get lots of rest.  Gargle with 8 oz of salt water ( tsp of salt per 1 qt of water) as often as every 1-2 hours to soothe your throat.  Throat lozenges (if you are not at risk for choking) or sprays may be used to soothe your throat. Contact a health care provider if:  You have large, tender lumps in your neck.  You have a rash.  You cough up green, yellow-brown, or bloody spit. Get help right away if:  Your neck becomes stiff.  You drool or are unable to swallow liquids.  You vomit or are unable to keep medicines or liquids down.  You have severe pain that does not go away with the use of recommended medicines.  You have trouble breathing (not caused by a stuffy nose). This information is not intended to replace advice given to you by your health care provider. Make sure you discuss any questions you have with your health care provider. Document Released: 08/28/2005 Document Revised: 02/03/2016 Document Reviewed: 05/05/2013 Elsevier Interactive Patient Education  2017 Elsevier Inc.  

## 2017-06-22 NOTE — Progress Notes (Signed)
Subjective:  By signing my name below, I, Stann Ore, attest that this documentation has been prepared under the direction and in the presence of Norberto Sorenson, MD. Electronically Signed: Stann Ore, Scribe. 06/22/2017 , 9:08 AM .  Patient was seen in Room 3 .   Patient ID: Katelyn Snow, female    DOB: 24-Sep-1980, 36 y.o.   MRN: [id number] Chief Complaint  Patient presents with  . Cough    x 5 days with sore throat    HPI Katelyn Snow is a 36 y.o. female who presents to Primary Care at [facility] complaining of dry cough with sore throat and voice loss that started 4 days ago. Patient states her symptoms started with some coughing, causing her headaches and sore throat. She felt warm, but didn't measure a fever. She's tried taking Delsym, Robitussin, and ginger tea without any relief. She has had difficulty with sleep due to symptoms. She denies coughing up any phlegm, chest tightness, chest pain, shortness of breath, blowing mucus out of her nose, or loss of appetite. She denies any recent antibiotics; denies yeast infections with antibiotics.   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   Past Medical History:  Diagnosis Date  . Blood transfusion without reported diagnosis   . Headache(784.0)   . Hypertension    Prior to Admission medications   Medication Sig Start Date End Date Taking? Authorizing Provider  amoxicillin (AMOXIL) 500 MG capsule Take 2 capsules (1,000 mg total) by mouth 3 (three) times daily. 05/28/15   Sherren Mocha, MD  aspirin-acetaminophen-caffeine (EXCEDRIN MIGRAINE) [phone] MG per tablet Take 2 tablets by mouth every 6 (six) hours as needed for headache.    [provider]  chlorpheniramine-HYDROcodone (TUSSIONEX PENNKINETIC ER) 10-8 MG/5ML SUER Take 5 mLs by mouth every 12 (twelve) hours as needed for cough. 05/28/15   Sherren Mocha, MD  fluconazole (DIFLUCAN) 150 MG tablet Take 1 tablet (150 mg total) by  mouth once. Repeat if needed 06/03/15   Brewington, Tishira R, PA-C  ipratropium (ATROVENT) 0.03 % nasal spray Place 2 sprays into the nose 4 (four) times daily. 05/28/15   Sherren Mocha, MD  medroxyPROGESTERone (PROVERA) 5 MG tablet Take 5 mg by mouth daily.    [provider]  ondansetron (ZOFRAN ODT) 8 MG disintegrating tablet Take 1 tablet (8 mg total) by mouth every 8 (eight) hours as needed for nausea or vomiting. 09/15/16   Kirichenko, Lemont Fillers, PA-C   No Known Allergies  History reviewed. No pertinent surgical history. Family History  Problem Relation Age of Onset  . Hypertension Mother   . Hypertension Father   . Hypertension Maternal Grandmother   . Diabetes Maternal Grandmother    Social History   Social History  . Marital status: Married    Spouse name: N/A  . Number of children: N/A  . Years of education: N/A   Social History Main Topics  . Smoking status: Never Smoker  . Smokeless tobacco: Never Used  . Alcohol use 0.6 oz/week    1 Standard drinks or equivalent per week     Comment: socially  . Drug use: No  . Sexual activity: Yes    Partners: Male   Other Topics Concern  . None   Social History Narrative  . None   Depression screen [facility] 2/9 06/22/2017 05/28/2015  Decreased Interest 0 0  Down, Depressed, Hopeless 0 0  PHQ - 2 Score 0  0    Review of Systems  Constitutional: Positive for fatigue and fever. Negative for activity change, appetite change and chills.  HENT: Positive for sinus pressure, sore throat and voice change. Negative for congestion, postnasal drip and rhinorrhea.   Respiratory: Positive for cough. Negative for chest tightness and shortness of breath.   Cardiovascular: Negative for chest pain.  Neurological: Positive for facial asymmetry.       Objective:   Physical Exam  Constitutional: She is oriented to person, place, and time. She appears well-developed and well-nourished. No distress.  HENT:  Head: Normocephalic and  atraumatic.  Right Ear: Tympanic membrane normal.  Left Ear: Tympanic membrane normal.  Nose: Nose normal.  Mouth/Throat: Posterior oropharyngeal erythema present. No oropharyngeal exudate.  Oropharynx: few ulcerations  Eyes: Pupils are equal, round, and reactive to light. EOM are normal.  Neck: Neck supple.  Cardiovascular: Normal rate, regular rhythm, S1 normal, S2 normal and normal heart sounds.   No murmur heard. Pulmonary/Chest: Effort normal and breath sounds normal. No respiratory distress. She has no wheezes.  Musculoskeletal: Normal range of motion.  Lymphadenopathy:       Head (right side): Tonsillar adenopathy present.       Head (left side): Tonsillar adenopathy present.    She has cervical adenopathy (anterior).  Neurological: She is alert and oriented to person, place, and time.  Skin: Skin is warm and dry.  Psychiatric: She has a normal mood and affect. Her behavior is normal.  Nursing note and vitals reviewed.   BP 128/88   Pulse 89   Temp 98.8 F (37.1 C) (Oral)   Resp 16   Ht 5' 4.17" (1.63 m)   Wt 204 lb (92.5 kg)   LMP 05/03/2017   SpO2 96%   BMI 34.83 kg/m   Results for orders placed or performed in visit on 06/22/17  POCT rapid strep A  Result Value Ref Range   Rapid Strep A Screen Negative Negative       Assessment & Plan:   1. Acute pharyngitis, unspecified etiology     Orders Placed This Encounter  Procedures  . Culture, Group A Strep    Order Specific Question:   Source    Answer:   oropharynx  . POCT rapid strep A    Meds ordered this encounter  Medications  . HYDROcodone-homatropine (HYCODAN) 5-1.5 MG/5ML syrup    Sig: Take 5 mLs by mouth every 4 (four) hours as needed for cough.    Dispense:  120 mL    Refill:  0  . azithromycin (ZITHROMAX) 250 MG tablet    Sig: Take 2 tabs PO x 1 dose, then 1 tab PO QD x 4 days    Dispense:  6 tablet    Refill:  0    I personally performed the services described in this documentation,  which was scribed in my presence. The recorded information has been reviewed and considered, and addended by me as needed.   Norberto Sorenson, M.D.  Primary Care at [facility] [address] [place], [place] [address] [phone] phone [phone] fax  06/23/17 5:43 AM

## 2017-06-25 LAB — CULTURE, GROUP A STREP: STREP A CULTURE: NEGATIVE

## 2017-10-10 ENCOUNTER — Encounter: Payer: Self-pay | Admitting: Physician Assistant

## 2017-10-10 ENCOUNTER — Ambulatory Visit (INDEPENDENT_AMBULATORY_CARE_PROVIDER_SITE_OTHER): Payer: BLUE CROSS/BLUE SHIELD | Admitting: Physician Assistant

## 2017-10-10 VITALS — BP 128/92 | HR 93 | Temp 97.8°F | Resp 16 | Ht 64.17 in | Wt 206.0 lb

## 2017-10-10 DIAGNOSIS — R300 Dysuria: Secondary | ICD-10-CM | POA: Diagnosis not present

## 2017-10-10 DIAGNOSIS — K5900 Constipation, unspecified: Secondary | ICD-10-CM

## 2017-10-10 DIAGNOSIS — R109 Unspecified abdominal pain: Secondary | ICD-10-CM

## 2017-10-10 LAB — POCT URINALYSIS DIP (MANUAL ENTRY)
Bilirubin, UA: NEGATIVE
GLUCOSE UA: NEGATIVE mg/dL
Ketones, POC UA: NEGATIVE mg/dL
Leukocytes, UA: NEGATIVE
Nitrite, UA: NEGATIVE
Protein Ur, POC: NEGATIVE mg/dL
RBC UA: NEGATIVE
SPEC GRAV UA: 1.025 (ref 1.010–1.025)
UROBILINOGEN UA: 0.2 U/dL
pH, UA: 5.5 (ref 5.0–8.0)

## 2017-10-10 LAB — POCT CBC
Granulocyte percent: 55.1 %G (ref 37–80)
HCT, POC: 36.4 % — AB (ref 37.7–47.9)
HEMOGLOBIN: 11.7 g/dL — AB (ref 12.2–16.2)
Lymph, poc: 2.1 (ref 0.6–3.4)
MCH: 22.7 pg — AB (ref 27–31.2)
MCHC: 32 g/dL (ref 31.8–35.4)
MCV: 71 fL — AB (ref 80–97)
MID (CBC): 0.4 (ref 0–0.9)
MPV: 7 fL (ref 0–99.8)
PLATELET COUNT, POC: 337 10*3/uL (ref 142–424)
POC Granulocyte: 3 (ref 2–6.9)
POC LYMPH PERCENT: 38.3 %L (ref 10–50)
POC MID %: 6.6 %M (ref 0–12)
RBC: 5.13 M/uL (ref 4.04–5.48)
RDW, POC: 18.8 %
WBC: 5.4 10*3/uL (ref 4.6–10.2)

## 2017-10-10 LAB — POCT URINE PREGNANCY: PREG TEST UR: NEGATIVE

## 2017-10-10 MED ORDER — POLYETHYLENE GLYCOL 3350 17 GM/SCOOP PO POWD: 17.0000 g | Freq: Two times a day (BID) | ORAL | 1 refills | Status: DC | PRN

## 2017-10-10 NOTE — Patient Instructions (Addendum)
Constipation, Adult Constipation is when a person:  Poops (has a bowel movement) fewer times in a week than normal.  Has a hard time pooping.  Has poop that is dry, hard, or bigger than normal.  Follow these instructions at home: Eating and drinking   Eat foods that have a lot of fiber, such as: ? Fresh fruits and vegetables. ? Whole grains. ? Beans.  Eat less of foods that are high in fat, low in fiber, or overly processed, such as: ? [place] fries. ? Hamburgers. ? Cookies. ? Candy. ? Soda.  Drink enough fluid to keep your pee (urine) clear or pale yellow. General instructions  Exercise regularly or as told by your doctor.  Go to the restroom when you feel like you need to poop. Do not hold it in.  Take over-the-counter and prescription medicines only as told by your doctor. These include any fiber supplements.  Do pelvic floor retraining exercises, such as: ? Doing deep breathing while relaxing your lower belly (abdomen). ? Relaxing your pelvic floor while pooping.  Watch your condition for any changes.  Keep all follow-up visits as told by your doctor. This is important. Contact a doctor if:  You have pain that gets worse.  You have a fever.  You have not pooped for 4 days.  You throw up (vomit).  You are not hungry.  You lose weight.  You are bleeding from the anus.  You have thin, pencil-like poop (stool). Get help right away if:  You have a fever, and your symptoms suddenly get worse.  You leak poop or have blood in your poop.  Your belly feels hard or bigger than normal (is bloated).  You have very bad belly pain.  You feel dizzy or you faint. This information is not intended to replace advice given to you by your health care provider. Make sure you discuss any questions you have with your health care provider. Document Released: 02/14/2008 Document Revised: 03/17/2016 Document Reviewed: 02/16/2016 Elsevier Interactive Patient Education   2018 [employer]     IF you received an x-ray today, you will receive an invoice from [facility]. Please contact [facility] at [phone] with questions or concerns regarding your invoice.   IF you received labwork today, you will receive an invoice from [facility]. Please contact [facility] at [phone] with questions or concerns regarding your invoice.   Our billing staff will not be able to assist you with questions regarding bills from these companies.  You will be contacted with the lab results as soon as they are available. The fastest way to get your results is to activate your My Chart account. Instructions are located on the last page of this paperwork. If you have not heard from us regarding the results in 2 weeks, please contact this office.

## 2017-10-10 NOTE — Progress Notes (Signed)
[facility] [address], [place] [id number] [phone]  Date:  10/10/2017   Name:  Katelyn Snow   DOB:  Mar 23, 1981   MRN:  [id number]  PCP:  System, Pcp Not In    History of Present Illness:  Katelyn DessShetarra M Fodera is a 37 y.o. female patient who presents to PCP with  Chief Complaint  Patient presents with  . Back Pain    describes as cramps  . Abdominal Pain    describes as pressure/ cramping, left lower quadrant  . Dysuria    Descibes as pressure when urinating     Patient is here today with abdominal pain for the last 3 days at the rlq.  Patient notes that it is a sharp pain.  She feels pressure with urination.  She notes some constipation.  No dysuria, hematuria, but may note frequency.  She denies fever.  She has changed in appetite chronic with the start of metformin. She has a hx of pcos and is attempting fertility.  She is also taking fertility injections.  She has noted nausea without emesis.    Patient Active Problem List   Diagnosis Date Noted  . New onset headache 04/10/2014    Past Medical History:  Diagnosis Date  . Blood transfusion without reported diagnosis   . Headache(784.0)   . Hypertension     No past surgical history on file.  Social History   Tobacco Use  . Smoking status: Never Smoker  . Smokeless tobacco: Never Used  Substance Use Topics  . Alcohol use: Yes    Alcohol/week: 0.6 oz    Types: 1 Standard drinks or equivalent per week    Comment: socially  . Drug use: No    Family History  Problem Relation Age of Onset  . Hypertension Mother   . Hypertension Father   . Hypertension Maternal Grandmother   . Diabetes Maternal Grandmother     No Known Allergies  Medication list has been reviewed and updated.  Current Outpatient Medications on File Prior to Visit  Medication Sig Dispense Refill  . letrozole (FEMARA) 2.5 MG tablet TAKE 3 TABLETS BY MOUTH EVERY DAY ON DAYS 3-7 OF MENSTRUAL CYCLE  3  . metFORMIN  (GLUCOPHAGE) 1000 MG tablet Take 1,000 mg by mouth 2 (two) times daily.  11  . OVIDREL 250 MCG/0.5ML injection     . HYDROcodone-homatropine (HYCODAN) 5-1.5 MG/5ML syrup Take 5 mLs by mouth every 4 (four) hours as needed for cough. (Patient not taking: Reported on 10/10/2017) 120 mL 0   No current facility-administered medications on file prior to visit.     ROS ROS otherwise unremarkable unless listed above.  Physical Examination: BP (!) 128/92   Pulse 93   Temp 97.8 F (36.6 C) (Oral)   Resp 16   Ht 5' 4.17" (1.63 m)   Wt 206 lb (93.4 kg)   LMP 09/20/2017   SpO2 97%   BMI 35.17 kg/m  Ideal Body Weight: Weight in (lb) to have BMI = 25: 146.1  Physical Exam  Constitutional: She is oriented to person, place, and time. She appears well-developed and well-nourished. No distress.  HENT:  Head: Normocephalic and atraumatic.  Right Ear: External ear normal.  Left Ear: External ear normal.  Eyes: Conjunctivae and EOM are normal. Pupils are equal, round, and reactive to light.  Cardiovascular: Normal rate.  Pulmonary/Chest: Effort normal. No respiratory distress.  Abdominal: Soft. Normal appearance and bowel sounds are normal. There is tenderness  in the right lower quadrant and left lower quadrant. There is no CVA tenderness.  Neurological: She is alert and oriented to person, place, and time.  Skin: She is not diaphoretic.  Psychiatric: She has a normal mood and affect. Her behavior is normal.     Results for orders placed or performed in visit on 10/10/17  POCT urinalysis dipstick  Result Value Ref Range   Color, UA yellow yellow   Clarity, UA clear clear   Glucose, UA negative negative mg/dL   Bilirubin, UA negative negative   Ketones, POC UA negative negative mg/dL   Spec Grav, UA 4.098 1.191 - 1.025   Blood, UA negative negative   pH, UA 5.5 5.0 - 8.0   Protein Ur, POC negative negative mg/dL   Urobilinogen, UA 0.2 0.2 or 1.0 E.U./dL   Nitrite, UA Negative Negative    Leukocytes, UA Negative Negative  POCT urine pregnancy  Result Value Ref Range   Preg Test, Ur Negative Negative  POCT CBC  Result Value Ref Range   WBC 5.4 4.6 - 10.2 K/uL   Lymph, poc 2.1 0.6 - 3.4   POC LYMPH PERCENT 38.3 10 - 50 %L   MID (cbc) 0.4 0 - 0.9   POC MID % 6.6 0 - 12 %M   POC Granulocyte 3.0 2 - 6.9   Granulocyte percent 55.1 37 - 80 %G   RBC 5.13 4.04 - 5.48 M/uL   Hemoglobin 11.7 (A) 12.2 - 16.2 g/dL   HCT, POC 47.8 (A) 29.5 - 47.9 %   MCV 71.0 (A) 80 - 97 fL   MCH, POC 22.7 (A) 27 - 31.2 pg   MCHC 32.0 31.8 - 35.4 g/dL   RDW, POC 62.1 %   Platelet Count, POC 337 142 - 424 K/uL   MPV 7.0 0 - 99.8 fL     Assessment and Plan: Katelyn Snow is a 37 y.o. female who is here today for cc of  Chief Complaint  Patient presents with  . Back Pain    describes as cramps  . Abdominal Pain    describes as pressure/ cramping, left lower quadrant  . Dysuria    Descibes as pressure when urinating  recommended kub to see if there is obvious constipation.  She declines due to fear of being pregnant.  She does not want hcg quant at this time due to cost. I will treat her for constipation.  Discussed with her alarming symptoms to warrant an immediate return and progression of symptoms alarms toward illness such as appendicitis.  She voiced understanding.  Given miralax, no more than 7 days bid, advised she can then decrease to qd.  Constipation lifestyle modifications given as well Abdominal pain, unspecified abdominal location - Plan: POCT urine pregnancy, POCT CBC, CANCELED: POCT urinalysis dipstick, CANCELED: POCT Microscopic Urinalysis ([facility])  Dysuria - Plan: POCT urinalysis dipstick, POCT CBC, CANCELED: POCT urinalysis dipstick, CANCELED: POCT Microscopic Urinalysis ([facility])  Constipation, unspecified constipation type - Plan: polyethylene glycol powder (GLYCOLAX/MIRALAX) powder  Trena Platt, PA-C [facility] 1/30/20199:03 PM

## 2017-11-14 ENCOUNTER — Inpatient Hospital Stay (HOSPITAL_COMMUNITY)
Admission: AD | Admit: 2017-11-14 | Discharge: 2017-11-14 | Disposition: A | Discharge status: Home / Self Care | Payer: BLUE CROSS/BLUE SHIELD | Source: Ambulatory Visit | Attending: Obstetrics and Gynecology | Admitting: Obstetrics and Gynecology

## 2017-11-14 ENCOUNTER — Encounter (HOSPITAL_COMMUNITY): Payer: Self-pay | Admitting: *Deleted

## 2017-11-14 ENCOUNTER — Inpatient Hospital Stay (HOSPITAL_COMMUNITY): Payer: BLUE CROSS/BLUE SHIELD

## 2017-11-14 DIAGNOSIS — R102 Pelvic and perineal pain: Secondary | ICD-10-CM | POA: Diagnosis present

## 2017-11-14 DIAGNOSIS — O30041 Twin pregnancy, dichorionic/diamniotic, first trimester: Secondary | ICD-10-CM

## 2017-11-14 DIAGNOSIS — N83201 Unspecified ovarian cyst, right side: Secondary | ICD-10-CM

## 2017-11-14 DIAGNOSIS — O10919 Unspecified pre-existing hypertension complicating pregnancy, unspecified trimester: Secondary | ICD-10-CM | POA: Diagnosis present

## 2017-11-14 DIAGNOSIS — O3481 Maternal care for other abnormalities of pelvic organs, first trimester: Secondary | ICD-10-CM | POA: Insufficient documentation

## 2017-11-14 DIAGNOSIS — O09891 Supervision of other high risk pregnancies, first trimester: Secondary | ICD-10-CM | POA: Diagnosis not present

## 2017-11-14 DIAGNOSIS — O30001 Twin pregnancy, unspecified number of placenta and unspecified number of amniotic sacs, first trimester: Secondary | ICD-10-CM | POA: Insufficient documentation

## 2017-11-14 DIAGNOSIS — Z3A08 8 weeks gestation of pregnancy: Secondary | ICD-10-CM | POA: Diagnosis not present

## 2017-11-14 DIAGNOSIS — O09511 Supervision of elderly primigravida, first trimester: Secondary | ICD-10-CM | POA: Insufficient documentation

## 2017-11-14 DIAGNOSIS — O208 Other hemorrhage in early pregnancy: Secondary | ICD-10-CM | POA: Diagnosis not present

## 2017-11-14 DIAGNOSIS — O418X11 Other specified disorders of amniotic fluid and membranes, first trimester, fetus 1: Secondary | ICD-10-CM

## 2017-11-14 DIAGNOSIS — O209 Hemorrhage in early pregnancy, unspecified: Secondary | ICD-10-CM | POA: Diagnosis present

## 2017-11-14 DIAGNOSIS — O161 Unspecified maternal hypertension, first trimester: Secondary | ICD-10-CM | POA: Diagnosis not present

## 2017-11-14 DIAGNOSIS — O26891 Other specified pregnancy related conditions, first trimester: Secondary | ICD-10-CM | POA: Diagnosis present

## 2017-11-14 DIAGNOSIS — O418X10 Other specified disorders of amniotic fluid and membranes, first trimester, not applicable or unspecified: Secondary | ICD-10-CM | POA: Diagnosis present

## 2017-11-14 DIAGNOSIS — O468X1 Other antepartum hemorrhage, first trimester: Secondary | ICD-10-CM

## 2017-11-14 LAB — URINALYSIS, ROUTINE W REFLEX MICROSCOPIC
Bilirubin Urine: NEGATIVE
Glucose, UA: NEGATIVE mg/dL
KETONES UR: NEGATIVE mg/dL
Leukocytes, UA: NEGATIVE
Nitrite: NEGATIVE
PROTEIN: NEGATIVE mg/dL
Specific Gravity, Urine: 1.013 (ref 1.005–1.030)
pH: 5 (ref 5.0–8.0)

## 2017-11-14 LAB — POCT PREGNANCY, URINE: PREG TEST UR: POSITIVE — AB

## 2017-11-14 NOTE — Discharge Instructions (Signed)
First Trimester of Pregnancy The first trimester of pregnancy is from week 1 until the end of week 13 (months 1 through 3). During this time, your baby will begin to develop inside you. At 6-8 weeks, the eyes and face are formed, and the heartbeat can be seen on ultrasound. At the end of 12 weeks, all the baby's organs are formed. Prenatal care is all the medical care you receive before the birth of your baby. Make sure you get good prenatal care and follow all of your doctor's instructions. Follow these instructions at home: Medicines  Take over-the-counter and prescription medicines only as told by your doctor. Some medicines are safe and some medicines are not safe during pregnancy.  Take a prenatal vitamin that contains at least 600 micrograms (mcg) of folic acid.  If you have trouble pooping (constipation), take medicine that will make your stool soft (stool softener) if your doctor approves. Eating and drinking  Eat regular, healthy meals.  Your doctor will tell you the amount of weight gain that is right for you.  Avoid raw meat and uncooked cheese.  If you feel sick to your stomach (nauseous) or throw up (vomit): ? Eat 4 or 5 small meals a day instead of 3 large meals. ? Try eating a few soda crackers. ? Drink liquids between meals instead of during meals.  To prevent constipation: ? Eat foods that are high in fiber, like fresh fruits and vegetables, whole grains, and beans. ? Drink enough fluids to keep your pee (urine) clear or pale yellow. Activity  Exercise only as told by your doctor. Stop exercising if you have cramps or pain in your lower belly (abdomen) or low back.  Do not exercise if it is too hot, too humid, or if you are in a place of great height (high altitude).  Try to avoid standing for long periods of time. Move your legs often if you must stand in one place for a long time.  Avoid heavy lifting.  Wear low-heeled shoes. Sit and stand up straight.  You  can have sex unless your doctor tells you not to. Relieving pain and discomfort  Wear a good support bra if your breasts are sore.  Take warm water baths (sitz baths) to soothe pain or discomfort caused by hemorrhoids. Use hemorrhoid cream if your doctor says it is okay.  Rest with your legs raised if you have leg cramps or low back pain.  If you have puffy, bulging veins (varicose veins) in your legs: ? Wear support hose or compression stockings as told by your doctor. ? Raise (elevate) your feet for 15 minutes, 3-4 times a day. ? Limit salt in your food. Prenatal care  Schedule your prenatal visits by the twelfth week of pregnancy.  Write down your questions. Take them to your prenatal visits.  Keep all your prenatal visits as told by your doctor. This is important. Safety  Wear your seat belt at all times when driving.  Make a list of emergency phone numbers. The list should include numbers for family, friends, the hospital, and police and fire departments. General instructions  Ask your doctor for a referral to a local prenatal class. Begin classes no later than at the start of month 6 of your pregnancy.  Ask for help if you need counseling or if you need help with nutrition. Your doctor can give you advice or tell you where to go for help.  Do not use hot tubs, steam rooms, or  saunas.  Do not douche or use tampons or scented sanitary pads.  Do not cross your legs for long periods of time.  Avoid all herbs and alcohol. Avoid drugs that are not approved by your doctor.  Do not use any tobacco products, including cigarettes, chewing tobacco, and electronic cigarettes. If you need help quitting, ask your doctor. You may get counseling or other support to help you quit.  Avoid cat litter boxes and soil used by cats. These carry germs that can cause birth defects in the baby and can cause a loss of your baby (miscarriage) or stillbirth.  Visit your dentist. At home, brush  your teeth with a soft toothbrush. Be gentle when you floss. Contact a doctor if:  You are dizzy.  You have mild cramps or pressure in your lower belly.  You have a nagging pain in your belly area.  You continue to feel sick to your stomach, you throw up, or you have watery poop (diarrhea).  You have a bad smelling fluid coming from your vagina.  You have pain when you pee (urinate).  You have increased puffiness (swelling) in your face, hands, legs, or ankles. Get help right away if:  You have a fever.  You are leaking fluid from your vagina.  You have spotting or bleeding from your vagina.  You have very bad belly cramping or pain.  You gain or lose weight rapidly.  You throw up blood. It may look like coffee grounds.  You are around people who have [place] measles, fifth disease, or chickenpox.  You have a very bad headache.  You have shortness of breath.  You have any kind of trauma, such as from a fall or a car accident. Summary  The first trimester of pregnancy is from week 1 until the end of week 13 (months 1 through 3).  To take care of yourself and your unborn baby, you will need to eat healthy meals, take medicines only if your doctor tells you to do so, and do activities that are safe for you and your baby.  Keep all follow-up visits as told by your doctor. This is important as your doctor will have to ensure that your baby is healthy and growing well. This information is not intended to replace advice given to you by your health care provider. Make sure you discuss any questions you have with your health care provider. Document Released: 02/14/2008 Document Revised: 09/05/2016 Document Reviewed: 09/05/2016 Elsevier Interactive Patient Education  2017 Elsevier Inc. Subchorionic Hematoma A subchorionic hematoma is a gathering of blood between the outer wall of the placenta and the inner wall of the womb (uterus). The placenta is the organ that connects the  fetus to the wall of the uterus. The placenta performs the feeding, breathing (oxygen to the fetus), and waste removal (excretory work) of the fetus. Subchorionic hematoma is the most common abnormality found on a result from ultrasonography done during the first trimester or early second trimester of pregnancy. If there has been little or no vaginal bleeding, early small hematomas usually shrink on their own and do not affect your baby or pregnancy. The blood is gradually absorbed over 1-2 weeks. When bleeding starts later in pregnancy or the hematoma is larger or occurs in an older pregnant woman, the outcome may not be as good. Larger hematomas may get bigger, which increases the chances for miscarriage. Subchorionic hematoma also increases the risk of premature detachment of the placenta from the uterus, preterm (premature)  labor, and stillbirth. °Follow these instructions at home: °· Stay on bed rest if your health care provider recommends this. Although bed rest will not prevent more bleeding or prevent a miscarriage, your health care provider may recommend bed rest until you are advised otherwise. °· Avoid heavy lifting (more than 10 lb [4.5 kg]), exercise, sexual intercourse, or douching as directed by your health care provider. °· Keep track of the number of pads you use each day and how soaked (saturated) they are. Write down this information. °· Do not use tampons. °· Keep all follow-up appointments as directed by your health care provider. Your health care provider may ask you to have follow-up blood tests or ultrasound tests or both. °Get help right away if: °· You have severe cramps in your stomach, back, abdomen, or pelvis. °· You have a fever. °· You pass large clots or tissue. Save any tissue for your health care provider to look at. °· Your bleeding increases or you become lightheaded, feel weak, or have fainting episodes. °This information is not intended to replace advice given to you by your  health care provider. Make sure you discuss any questions you have with your health care provider. °Document Released: 12/13/2006 Document Revised: 02/03/2016 Document Reviewed: 03/27/2013 °Elsevier Interactive Patient Education © 2017 Elsevier Inc. ° °

## 2017-11-14 NOTE — MAU Note (Signed)
Pt presents to MAU c/o lower abdominal cramping and bright red bleeding that started around 2345 on 11/13/17. Pt states she first noticed it when she first went to the br and it was a light pink pt then states she went to the couch and about 20min later she felt something wet and noticed the bright red bleeding.

## 2017-11-14 NOTE — MAU Provider Note (Addendum)
Chief Complaint: Abdominal Pain and Vaginal Bleeding   First Provider Initiated Contact with Patient 11/14/17 0125        SUBJECTIVE HPI: Katelyn Snow is a 37 y.o. G1P0 at 829w6d by LMP who presents to maternity admissions reporting lower pelvic cramping and bright red bleeding.  Started at 2345.  Marland Kitchen. She denies vaginal itching/burning, urinary symptoms, h/a, dizziness, n/v, or fever/chills.    Was a patient of Dr Richardson Doppole.  Was referred to Dr April MansonYalcinkaya for fertility therapy.  Had medication but conceived naturally, no IUI or in vitro.  Plans to go back to Dr Richardson Doppole after 8 weeks.  Did not call Dr April MansonYalcinkaya or [facility] about bleeding.  Abdominal Pain  This is a new problem. The current episode started today. The problem has been waxing and waning. The pain is located in the suprapubic region. The pain is mild. The quality of the pain is cramping. The abdominal pain does not radiate. Pertinent negatives include no constipation, diarrhea, dysuria or headaches. Nothing aggravates the pain. The pain is relieved by nothing. She has tried nothing for the symptoms.  Vaginal Bleeding  The patient's primary symptoms include pelvic pain and vaginal bleeding. The patient's pertinent negatives include no genital itching, genital lesions or genital odor. This is a new problem. The current episode started today. The problem occurs intermittently. The pain is mild. She is pregnant. Associated symptoms include abdominal pain. Pertinent negatives include no chills, constipation, diarrhea, discolored urine, dysuria, flank pain or headaches. The vaginal discharge was bloody. The vaginal bleeding is lighter than menses. She has not been passing clots. She has not been passing tissue. Nothing aggravates the symptoms. She has tried nothing for the symptoms. She is not sexually active.    RN Note: Pt presents to MAU c/o lower abdominal cramping and bright red bleeding that started around 2345 on 11/13/17. Pt states she first  noticed it when she first went to the br and it was a light pink pt then states she went to the couch and about 20min later she felt something wet and noticed the bright red bleeding.     Past Medical History:  Diagnosis Date  . Blood transfusion without reported diagnosis   . Headache(784.0)   . Hypertension    Past Surgical History:  Procedure Laterality Date  . NO PAST SURGERIES     Social History   Socioeconomic History  . Marital status: Married    Spouse name: Not on file  . Number of children: Not on file  . Years of education: Not on file  . Highest education level: Not on file  Social Needs  . Financial resource strain: Not on file  . Food insecurity - worry: Not on file  . Food insecurity - inability: Not on file  . Transportation needs - medical: Not on file  . Transportation needs - non-medical: Not on file  Occupational History  . Not on file  Tobacco Use  . Smoking status: Never Smoker  . Smokeless tobacco: Never Used  Substance and Sexual Activity  . Alcohol use: Yes    Alcohol/week: 0.6 oz    Types: 1 Standard drinks or equivalent per week    Comment: socially  . Drug use: No  . Sexual activity: Yes    Partners: Male  Other Topics Concern  . Not on file  Social History Narrative  . Not on file   No current facility-administered medications on file prior to encounter.    Current  Outpatient Medications on File Prior to Encounter  Medication Sig Dispense Refill  . metFORMIN (GLUCOPHAGE) 1000 MG tablet Take 1,000 mg by mouth 2 (two) times daily.  11  . Prenatal Vit-Fe Fumarate-FA (PRENATAL MULTIVITAMIN) TABS tablet Take 1 tablet by mouth daily at 12 noon.    Marland Kitchen HYDROcodone-homatropine (HYCODAN) 5-1.5 MG/5ML syrup Take 5 mLs by mouth every 4 (four) hours as needed for cough. (Patient not taking: Reported on 10/10/2017) 120 mL 0  . letrozole (FEMARA) 2.5 MG tablet TAKE 3 TABLETS BY MOUTH EVERY DAY ON DAYS 3-7 OF MENSTRUAL CYCLE  3  . OVIDREL 250 MCG/0.5ML  injection     . polyethylene glycol powder (GLYCOLAX/MIRALAX) powder Take 17 g by mouth 2 (two) times daily as needed. 289 g 1   No Known Allergies  I have reviewed patient's Past Medical Hx, Surgical Hx, Family Hx, Social Hx, medications and allergies.   ROS:  Review of Systems  Constitutional: Negative for chills.  Gastrointestinal: Positive for abdominal pain. Negative for constipation and diarrhea.  Genitourinary: Positive for pelvic pain and vaginal bleeding. Negative for dysuria and flank pain.  Neurological: Negative for headaches.   Review of Systems  Other systems negative   Physical Exam  Physical Exam Patient Vitals for the past 24 hrs:  BP Temp Temp src Pulse Resp Height Weight  11/14/17 0118 (!) 148/84 98.3 F (36.8 C) Oral 85 18 5\' 2"  (1.575 m) 209 lb (94.8 kg)   Constitutional: Well-developed, well-nourished female in no acute distress.  Cardiovascular: normal rate Respiratory: normal effort GI: Abd soft, non-tender. Pos BS x 4 MS: Extremities nontender, no edema, normal ROM Neurologic: Alert and oriented x 4.  GU: Neg CVAT.  PELVIC EXAM: Cervix pink, visually closed, without lesion, scant light red creamy discharge, vaginal walls and external genitalia normal Cervix long and closed.   LAB RESULTS Results for orders placed or performed during the hospital encounter of 11/14/17 (from the past 24 hour(s))  Urinalysis, Routine w reflex microscopic     Status: Abnormal   Collection Time: 11/14/17 12:48 AM  Result Value Ref Range   Color, Urine YELLOW YELLOW   APPearance HAZY (A) CLEAR   Specific Gravity, Urine 1.013 1.005 - 1.030   pH 5.0 5.0 - 8.0   Glucose, UA NEGATIVE NEGATIVE mg/dL   Hgb urine dipstick LARGE (A) NEGATIVE   Bilirubin Urine NEGATIVE NEGATIVE   Ketones, ur NEGATIVE NEGATIVE mg/dL   Protein, ur NEGATIVE NEGATIVE mg/dL   Nitrite NEGATIVE NEGATIVE   Leukocytes, UA NEGATIVE NEGATIVE   RBC / HPF 0-5 0 - 5 RBC/hpf   WBC, UA 0-5 0 - 5 WBC/hpf    Bacteria, UA RARE (A) NONE SEEN   Squamous Epithelial / LPF 0-5 (A) NONE SEEN   Mucus PRESENT   Pregnancy, urine POC     Status: Abnormal   Collection Time: 11/14/17  1:06 AM  Result Value Ref Range   Preg Test, Ur POSITIVE (A) NEGATIVE   Cultures not done as they were done in office    IMAGING US Ob Comp Less 14 Wks  Result Date: 11/14/2017 CLINICAL DATA:  Initial evaluation for vaginal bleeding, early pregnancy. EXAM: TWIN OBSTETRICAL ULTRASOUND <14 WKS COMPARISON:  None. FINDINGS: Number of IUPs:  2 Chorionicity/Amnionicity:  Dichorionic, diamniotic. TWIN 1 Yolk sac:  Present Embryo:  Present Cardiac Activity: Present Heart Rate: 168 bpm CRL:   18.6 mm   8 w 2 d  [place] EDC: 06/24/2018 TWIN 2 Yolk sac:  Present Embryo:  Present Cardiac Activity: Present Heart Rate: 171 bpm CRL:   19.7 mm   8 w 3 d                  [place] EDC: 06/23/2018 Subchorionic hemorrhage: Small subchorionic hemorrhage measuring 1.6 x 0.6 x 0.9 cm without associated mass effect. Maternal uterus/adnexae: Complex septated right ovarian cyst measures 6.4 x 5.3 x 4.9 cm no significant internal vascularity. Left ovary normal in appearance. No free fluid within the pelvis. IMPRESSION: 1. Twin pregnancy as above, estimated gestational age approximately 8 weeks and 2-3 days by crown-rump length. 2. Small subchorionic hemorrhage without associated mass effect. 3. 6.4 cm complex septated right ovarian cyst. A follow-up ultrasound in 6-12 weeks is suggested for further evaluation. Electronically Signed   By: Rise Mu M.D.   On: 11/14/2017 02:38     MAU Management/MDM: Ordered usual first trimester r/o ectopic labs, but cancelled them as she is followed closely in office Pelvic exam done Will check baseline Ultrasound to rule out miscarriage  Consult Dr Dion Body with presentation, exam findings, and results.    This bleeding/pain can represent a normal pregnancy with bleeding, spontaneous abortion  The  process as listed above helps to determine which of these is present.  US findings reviewed which indicate small subchorionic hemorrhage Discussed nature and expected course with this Recommend pelvic rest and close followup   ASSESSMENT 1. Bleeding in early pregnancy   2. Bleeding in early pregnancy   3.     Small subchorionic hemorrhage 4.     Twin gestation at [redacted]w[redacted]d with heartbeats x 2  PLAN Discharge home Followup with Dr April Manson and Dr Richardson Dopp  Pt stable at time of discharge. Encouraged to return here or to other Urgent Care/ED if she develops worsening of symptoms, increase in pain, fever, or other concerning symptoms.    Wynelle Bourgeois CNM, MSN Certified Nurse-Midwife 11/14/2017  1:37 AM

## 2017-11-26 ENCOUNTER — Inpatient Hospital Stay (HOSPITAL_COMMUNITY)
Admission: AD | Admit: 2017-11-26 | Discharge: 2017-11-26 | Disposition: A | Discharge status: Home / Self Care | Payer: BLUE CROSS/BLUE SHIELD | Source: Ambulatory Visit | Attending: Obstetrics and Gynecology | Admitting: Obstetrics and Gynecology

## 2017-11-26 ENCOUNTER — Encounter (HOSPITAL_COMMUNITY): Payer: Self-pay | Admitting: *Deleted

## 2017-11-26 DIAGNOSIS — R109 Unspecified abdominal pain: Secondary | ICD-10-CM | POA: Diagnosis not present

## 2017-11-26 DIAGNOSIS — O10911 Unspecified pre-existing hypertension complicating pregnancy, first trimester: Secondary | ICD-10-CM | POA: Diagnosis not present

## 2017-11-26 DIAGNOSIS — Z833 Family history of diabetes mellitus: Secondary | ICD-10-CM | POA: Diagnosis not present

## 2017-11-26 DIAGNOSIS — T6291XA Toxic effect of unspecified noxious substance eaten as food, accidental (unintentional), initial encounter: Secondary | ICD-10-CM | POA: Diagnosis not present

## 2017-11-26 DIAGNOSIS — X58XXXA Exposure to other specified factors, initial encounter: Secondary | ICD-10-CM | POA: Insufficient documentation

## 2017-11-26 DIAGNOSIS — Z3A09 9 weeks gestation of pregnancy: Secondary | ICD-10-CM | POA: Diagnosis not present

## 2017-11-26 DIAGNOSIS — O26891 Other specified pregnancy related conditions, first trimester: Secondary | ICD-10-CM

## 2017-11-26 DIAGNOSIS — Z79899 Other long term (current) drug therapy: Secondary | ICD-10-CM | POA: Insufficient documentation

## 2017-11-26 DIAGNOSIS — Z7984 Long term (current) use of oral hypoglycemic drugs: Secondary | ICD-10-CM | POA: Insufficient documentation

## 2017-11-26 DIAGNOSIS — Z8249 Family history of ischemic heart disease and other diseases of the circulatory system: Secondary | ICD-10-CM | POA: Diagnosis not present

## 2017-11-26 DIAGNOSIS — Z79891 Long term (current) use of opiate analgesic: Secondary | ICD-10-CM | POA: Diagnosis not present

## 2017-11-26 DIAGNOSIS — IMO0001 Reserved for inherently not codable concepts without codable children: Secondary | ICD-10-CM

## 2017-11-26 DIAGNOSIS — O10919 Unspecified pre-existing hypertension complicating pregnancy, unspecified trimester: Secondary | ICD-10-CM

## 2017-11-26 LAB — URINALYSIS, ROUTINE W REFLEX MICROSCOPIC
Bilirubin Urine: NEGATIVE
Glucose, UA: NEGATIVE mg/dL
HGB URINE DIPSTICK: NEGATIVE
Ketones, ur: NEGATIVE mg/dL
LEUKOCYTES UA: NEGATIVE
NITRITE: NEGATIVE
PROTEIN: NEGATIVE mg/dL
Specific Gravity, Urine: 1.011 (ref 1.005–1.030)
pH: 5 (ref 5.0–8.0)

## 2017-11-26 MED ORDER — ONDANSETRON HCL 8 MG PO TABS: 8.0000 mg | ORAL_TABLET | Freq: Three times a day (TID) | ORAL | 0 refills | Status: DC | PRN

## 2017-11-26 MED ORDER — DIPHENOXYLATE-ATROPINE 2.5-0.025 MG PO TABS
2.0000 | ORAL_TABLET | Freq: Once | ORAL | Status: AC
  Administered 2017-11-26: 2 via ORAL
  Filled 2017-11-26: qty 2

## 2017-11-26 NOTE — Discharge Instructions (Signed)
Bland Diet A bland diet consists of foods that do not have a lot of fat or fiber. Foods without fat or fiber are easier for the body to digest. They are also less likely to irritate your mouth, throat, stomach, and other parts of your gastrointestinal tract. A bland diet is sometimes called a BRAT diet. What is my plan? Your health care provider or dietitian may recommend specific changes to your diet to prevent and treat your symptoms, such as:  Eating small meals often.  Cooking food until it is soft enough to chew easily.  Chewing your food well.  Drinking fluids slowly.  Not eating foods that are very spicy, sour, or fatty.  Not eating citrus fruits, such as oranges and grapefruit.  What do I need to know about this diet?  Eat a variety of foods from the bland diet food list.  Do not follow a bland diet longer than you have to.  Ask your health care provider whether you should take vitamins. What foods can I eat? Grains  Hot cereals, such as cream of wheat. Bread, crackers, or tortillas made from refined white flour. Rice. Vegetables Canned or cooked vegetables. Mashed or boiled potatoes. Fruits Bananas. Applesauce. Other types of cooked or canned fruit with the skin and seeds removed, such as canned peaches or pears. Meats and Other Protein Sources Scrambled eggs. Creamy peanut butter or other nut butters. Lean, well-cooked meats, such as chicken or fish. Tofu. Soups or broths. Dairy Low-fat dairy products, such as milk, cottage cheese, or yogurt. Beverages Water. Herbal tea. Apple juice. Sweets and Desserts Pudding. Custard. Fruit gelatin. Ice cream. Fats and Oils Mild salad dressings. Canola or olive oil. The items listed above may not be a complete list of allowed foods or beverages. Contact your dietitian for more options. What foods are not recommended? Foods and ingredients that are often not recommended include:  Spicy foods, such as hot sauce or  salsa.  Fried foods.  Sour foods, such as pickled or fermented foods.  Raw vegetables or fruits, especially citrus or berries.  Caffeinated drinks.  Alcohol.  Strongly flavored seasonings or condiments.  The items listed above may not be a complete list of foods and beverages that are not allowed. Contact your dietitian for more information. This information is not intended to replace advice given to you by your health care provider. Make sure you discuss any questions you have with your health care provider. Document Released: 12/20/2015 Document Revised: 02/03/2016 Document Reviewed: 09/09/2014 Elsevier Interactive Patient Education  2018 Elsevier Inc[employer] Food Choices to Help Relieve Diarrhea, Adult When you have diarrhea, the foods you eat and your eating habits are very important. Choosing the right foods and drinks can help:  Relieve diarrhea.  Replace lost fluids and nutrients.  Prevent dehydration.  What general guidelines should I follow? Relieving diarrhea  Choose foods with less than 2 g or .07 oz. of fiber per serving.  Limit fats to less than 8 tsp (38 g or 1.34 oz.) a day.  Avoid the following: ? Foods and beverages sweetened with high-fructose corn syrup, honey, or sugar alcohols such as xylitol, sorbitol, and mannitol. ? Foods that contain a lot of fat or sugar. ? Fried, greasy, or spicy foods. ? High-fiber grains, breads, and cereals. ? Raw fruits and vegetables.  Eat foods that are rich in probiotics. These foods include dairy products such as yogurt and fermented milk products. They help increase healthy bacteria in the stomach and intestines (gastrointestinal   tract, or GI tract).  If you have lactose intolerance, avoid dairy products. These may make your diarrhea worse.  Take medicine to help stop diarrhea (antidiarrheal medicine) only as told by your health care provider. Replacing nutrients  Eat small meals or snacks every 3-4 hours.  Eat bland  foods, such as white rice, toast, or baked potato, until your diarrhea starts to get better. Gradually reintroduce nutrient-rich foods as tolerated or as told by your health care provider. This includes: ? Well-cooked protein foods. ? Peeled, seeded, and soft-cooked fruits and vegetables. ? Low-fat dairy products.  Take vitamin and mineral supplements as told by your health care provider. Preventing dehydration   Start by sipping water or a special solution to prevent dehydration (oral rehydration solution, ORS). Urine that is clear or pale yellow means that you are getting enough fluid.  Try to drink at least 8-10 cups of fluid each day to help replace lost fluids.  You may add other liquids in addition to water, such as clear juice or decaffeinated sports drinks, as tolerated or as told by your health care provider.  Avoid drinks with caffeine, such as coffee, tea, or soft drinks.  Avoid alcohol. What foods are recommended? The items listed may not be a complete list. Talk with your health care provider about what dietary choices are best for you. Grains White rice. White, JamaicaFrench, or pita breads (fresh or toasted), including plain rolls, buns, or bagels. White pasta. Saltine, soda, or graham crackers. Pretzels. Low-fiber cereal. Cooked cereals made with water (such as cornmeal, farina, or cream cereals). Plain muffins. Matzo. Melba toast. Zwieback. Vegetables Potatoes (without the skin). Most well-cooked and canned vegetables without skins or seeds. Tender lettuce. Fruits Apple sauce. Fruits canned in juice. Cooked apricots, cherries, grapefruit, peaches, pears, or plums. Fresh bananas and cantaloupe. Meats and other protein foods Baked or boiled chicken. Eggs. Tofu. Fish. Seafood. Smooth nut butters. Ground or well-cooked tender beef, ham, veal, lamb, pork, or poultry. Dairy Plain yogurt, kefir, and unsweetened liquid yogurt. Lactose-free milk, buttermilk, skim milk, or soy milk.  Low-fat or nonfat hard cheese. Beverages Water. Low-calorie sports drinks. Fruit juices without pulp. Strained tomato and vegetable juices. Decaffeinated teas. Sugar-free beverages not sweetened with sugar alcohols. Oral rehydration solutions, if approved by your health care provider. Seasoning and other foods Bouillon, broth, or soups made from recommended foods. What foods are not recommended? The items listed may not be a complete list. Talk with your health care provider about what dietary choices are best for you. Grains Whole grain, whole wheat, bran, or rye breads, rolls, pastas, and crackers. Wild or brown rice. Whole grain or bran cereals. Barley. Oats and oatmeal. Corn tortillas or taco shells. Granola. Popcorn. Vegetables Raw vegetables. Fried vegetables. Cabbage, broccoli, Brussels sprouts, artichokes, baked beans, beet greens, corn, kale, legumes, peas, sweet potatoes, and yams. Potato skins. Cooked spinach and cabbage. Fruits Dried fruit, including raisins and dates. Raw fruits. Stewed or dried prunes. Canned fruits with syrup. Meat and other protein foods Fried or fatty meats. Deli meats. Chunky nut butters. Nuts and seeds. Beans and lentils. Tomasa BlaseBacon. Hot dogs. Sausage. Dairy High-fat cheeses. Whole milk, chocolate milk, and beverages made with milk, such as milk shakes. Half-and-half. Cream. sour cream. Ice cream. Beverages Caffeinated beverages (such as coffee, tea, soda, or energy drinks). Alcoholic beverages. Fruit juices with pulp. Prune juice. Soft drinks sweetened with high-fructose corn syrup or sugar alcohols. High-calorie sports drinks. Fats and oils Butter. Cream sauces. Margarine. Salad oils. Plain  salad dressings. Olives. Avocados. Mayonnaise. Sweets and desserts Sweet rolls, doughnuts, and sweet breads. Sugar-free desserts sweetened with sugar alcohols such as xylitol and sorbitol. Seasoning and other foods Honey. Hot sauce. Chili powder. Gravy. Cream-based or  milk-based soups. Pancakes and waffles. Summary  When you have diarrhea, the foods you eat and your eating habits are very important.  Make sure you get at least 8-10 cups of fluid each day, or enough to keep your urine clear or pale yellow.  Eat bland foods and gradually reintroduce healthy, nutrient-rich foods as tolerated, or as told by your health care provider.  Avoid high-fiber, fried, greasy, or spicy foods. This information is not intended to replace advice given to you by your health care provider. Make sure you discuss any questions you have with your health care provider. Document Released: 11/18/2003 Document Revised: 08/25/2016 Document Reviewed: 08/25/2016 Elsevier Interactive Patient Education  2018 Elsevier Inc.  

## 2017-11-26 NOTE — MAU Provider Note (Addendum)
Patient Katelyn Snow is a 37 y.o. G1P0 at 1564w4d here with complaints of nausea, vomiting and diarrhea. She denies vaginal bleeding, abdominal pain or other ob-gyn complaint.  History     Patient states that she and her [place] [employer] around 7[place] last night. At 8 pm she had vomiting twice. She also had multiple episodes of diarrhea overnight. She reports that the diarrhea was not bloody; it was slightly green, which is normal for her. The emesis was the contents of her stomach. There was no blood in the emesis. Nothing made it better or worse. She tried eating chick fil a today, which she didn't throw up. The last time she had diarrhea was an hour before she got here.   CSN: [id number]  Arrival date and time: 11/26/17 2036   None     Chief Complaint  Patient presents with  . Abdominal Pain     OB History    Gravida Para Term Preterm AB Living   1             SAB TAB Ectopic Multiple Live Births                  Past Medical History:  Diagnosis Date  . Blood transfusion without reported diagnosis   . Headache(784.0)   . Hypertension     Past Surgical History:  Procedure Laterality Date  . NO PAST SURGERIES      Family History  Problem Relation Age of Onset  . Hypertension Mother   . Hypertension Father   . Hypertension Maternal Grandmother   . Diabetes Maternal Grandmother     Social History   Tobacco Use  . Smoking status: Never Smoker  . Smokeless tobacco: Never Used  Substance Use Topics  . Alcohol use: Yes    Alcohol/week: 0.6 oz    Types: 1 Standard drinks or equivalent per week    Comment: socially  . Drug use: No    Allergies: No Known Allergies  Medications Prior to Admission  Medication Sig Dispense Refill Last Dose  . HYDROcodone-homatropine (HYCODAN) 5-1.5 MG/5ML syrup Take 5 mLs by mouth every 4 (four) hours as needed for cough. (Patient not taking: Reported on 10/10/2017) 120 mL 0 Not Taking  . letrozole (FEMARA) 2.5 MG tablet  TAKE 3 TABLETS BY MOUTH EVERY DAY ON DAYS 3-7 OF MENSTRUAL CYCLE  3 More than a month at Unknown time  . metFORMIN (GLUCOPHAGE) 1000 MG tablet Take 1,000 mg by mouth 2 (two) times daily.  11 Past Month at Unknown time  . OVIDREL 250 MCG/0.5ML injection    Taking  . polyethylene glycol powder (GLYCOLAX/MIRALAX) powder Take 17 g by mouth 2 (two) times daily as needed. 289 g 1 Unknown at Unknown time  . Prenatal Vit-Fe Fumarate-FA (PRENATAL MULTIVITAMIN) TABS tablet Take 1 tablet by mouth daily at 12 noon.   11/13/2017 at Unknown time    Review of Systems  Constitutional: Positive for fatigue. Negative for fever.  HENT: Negative.   Respiratory: Negative.   Cardiovascular: Negative.   Gastrointestinal: Positive for abdominal pain, diarrhea, nausea and vomiting.  Endocrine: Negative.   Genitourinary: Negative.   Musculoskeletal: Negative.   Skin: Negative.   Neurological: Positive for weakness.   Physical Exam   Blood pressure (!) 140/96, pulse 77, temperature 98.5 F (36.9 C), temperature source Oral, resp. rate 20, height 5\' 2"  (1.575 m), weight 206 lb 4 oz (93.6 kg), last menstrual period 09/20/2017.  Physical Exam  Constitutional: She is oriented to person, place, and time. She appears well-developed.  HENT:  Head: Normocephalic.  Neck: Normal range of motion.  Respiratory: Effort normal.  GI: Soft. She exhibits distension. She exhibits no mass. There is tenderness. There is no rebound and no guarding.  Musculoskeletal: Normal range of motion.  Neurological: She is alert and oriented to person, place, and time. She has normal reflexes.  Skin: Skin is warm and dry.  Psychiatric: She has a normal mood and affect.    MAU Course  Procedures  MDM -Tolerated ginger ale -UA shows no signs of dehydration.  -lomotil given Assessment and Plan   1. Food poisoning, accidental or unintentional, initial encounter   2. Chronic hypertension during pregnancy, antepartum    2. Patient  stable for discharge. Reviewed patient's physical presentation, UA results and history with Dr. Richardson Dopp, who agrees with plan of care.  3. Patient will keep ob-gyn upcoming appointment.  4. RX for Zofran given; recommendations on Brat diet.   Charlesetta Garibaldi Delphine Sizemore 11/26/2017, 9:35 PM

## 2017-11-26 NOTE — MAU Note (Signed)
PT SAYS SHE AND HUSBAND  ATE AT [employer] [facility] LAST NIGHT -   - THEN 30 MIN LATER  SHE HAD ABD  PAIN -  VOMITED/ DIARRHEA .   THEN TODAY PAIN AND DIARRHEA    [facility]  WITH  [facility]

## 2017-12-10 LAB — OB RESULTS CONSOLE ABO/RH: RH TYPE: POSITIVE

## 2017-12-10 LAB — OB RESULTS CONSOLE RUBELLA ANTIBODY, IGM: RUBELLA: IMMUNE

## 2017-12-10 LAB — OB RESULTS CONSOLE RPR: RPR: NONREACTIVE

## 2017-12-10 LAB — OB RESULTS CONSOLE HEPATITIS B SURFACE ANTIGEN: HEP B S AG: NEGATIVE

## 2017-12-10 LAB — OB RESULTS CONSOLE GC/CHLAMYDIA
Chlamydia: NEGATIVE
Gonorrhea: NEGATIVE

## 2017-12-10 LAB — OB RESULTS CONSOLE HIV ANTIBODY (ROUTINE TESTING): HIV: NONREACTIVE

## 2017-12-11 ENCOUNTER — Encounter: Payer: Self-pay | Admitting: Physician Assistant

## 2018-01-16 ENCOUNTER — Inpatient Hospital Stay (HOSPITAL_COMMUNITY)
Admission: AD | Admit: 2018-01-16 | Discharge: 2018-01-16 | Disposition: A | Discharge status: Home / Self Care | Payer: BLUE CROSS/BLUE SHIELD | Source: Ambulatory Visit | Attending: Obstetrics and Gynecology | Admitting: Obstetrics and Gynecology

## 2018-01-16 ENCOUNTER — Encounter (HOSPITAL_COMMUNITY): Payer: Self-pay | Admitting: *Deleted

## 2018-01-16 DIAGNOSIS — O10919 Unspecified pre-existing hypertension complicating pregnancy, unspecified trimester: Secondary | ICD-10-CM

## 2018-01-16 DIAGNOSIS — R102 Pelvic and perineal pain: Secondary | ICD-10-CM | POA: Insufficient documentation

## 2018-01-16 DIAGNOSIS — Z466 Encounter for fitting and adjustment of urinary device: Secondary | ICD-10-CM | POA: Insufficient documentation

## 2018-01-16 NOTE — MAU Note (Signed)
PT SAYS SHE WAS IN OFFICE TODAY - DR Mindi Slicker PUT IN A RING   IN HER VAGINA- TO RELIEVE PRESSURE.    HAS BEEN HURTING  SINCE IN OFFICE.   PT WANTS IT OUT.

## 2018-01-16 NOTE — Discharge Instructions (Signed)
Second Trimester of Pregnancy The second trimester is from week 13 through week 28, month 4 through 6. This is often the time in pregnancy that you feel your best. Often times, morning sickness has lessened or quit. You may have more energy, and you may get hungry more often. Your unborn baby (fetus) is growing rapidly. At the end of the sixth month, he or she is about 9 inches long and weighs about 1 pounds. You will likely feel the baby move (quickening) between 18 and 20 weeks of pregnancy. Follow these instructions at home:  Avoid all smoking, herbs, and alcohol. Avoid drugs not approved by your doctor.  Do not use any tobacco products, including cigarettes, chewing tobacco, and electronic cigarettes. If you need help quitting, ask your doctor. You may get counseling or other support to help you quit.  Only take medicine as told by your doctor. Some medicines are safe and some are not during pregnancy.  Exercise only as told by your doctor. Stop exercising if you start having cramps.  Eat regular, healthy meals.  Wear a good support bra if your breasts are tender.  Do not use hot tubs, steam rooms, or saunas.  Wear your seat belt when driving.  Avoid raw meat, uncooked cheese, and liter boxes and soil used by cats.  Take your prenatal vitamins.  Take 1500-2000 milligrams of calcium daily starting at the 20th week of pregnancy until you deliver your baby.  Try taking medicine that helps you poop (stool softener) as needed, and if your doctor approves. Eat more fiber by eating fresh fruit, vegetables, and whole grains. Drink enough fluids to keep your pee (urine) clear or pale yellow.  Take warm water baths (sitz baths) to soothe pain or discomfort caused by hemorrhoids. Use hemorrhoid cream if your doctor approves.  If you have puffy, bulging veins (varicose veins), wear support hose. Raise (elevate) your feet for 15 minutes, 3-4 times a day. Limit salt in your diet.  Avoid heavy  lifting, wear low heals, and sit up straight.  Rest with your legs raised if you have leg cramps or low back pain.  Visit your dentist if you have not gone during your pregnancy. Use a soft toothbrush to brush your teeth. Be gentle when you floss.  You can have sex (intercourse) unless your doctor tells you not to.  Go to your doctor visits. Get help if:  You feel dizzy.  You have mild cramps or pressure in your lower belly (abdomen).  You have a nagging pain in your belly area.  You continue to feel sick to your stomach (nauseous), throw up (vomit), or have watery poop (diarrhea).  You have bad smelling fluid coming from your vagina.  You have pain with peeing (urination). Get help right away if:  You have a fever.  You are leaking fluid from your vagina.  You have spotting or bleeding from your vagina.  You have severe belly cramping or pain.  You lose or gain weight rapidly.  You have trouble catching your breath and have chest pain.  You notice sudden or extreme puffiness (swelling) of your face, hands, ankles, feet, or legs.  You have not felt the baby move in over an hour.  You have severe headaches that do not go away with medicine.  You have vision changes. This information is not intended to replace advice given to you by your health care provider. Make sure you discuss any questions you have with your health care  provider. Document Released: 11/22/2009 Document Revised: 02/03/2016 Document Reviewed: 10/29/2012 Elsevier Interactive Patient Education  2017 [employer]. Keep all scheduled appointments

## 2018-01-16 NOTE — MAU Provider Note (Signed)
Pt reports sharp shooting vaginal pain and discomfort a few hours after pessary plaed. Came to MAU to have removed Removed pessary with no difficulty Pt will keep scheduled appt in office

## 2018-01-23 ENCOUNTER — Encounter (HOSPITAL_COMMUNITY): Payer: Self-pay | Admitting: Emergency Medicine

## 2018-01-24 DIAGNOSIS — O26872 Cervical shortening, second trimester: Secondary | ICD-10-CM | POA: Diagnosis present

## 2018-01-24 NOTE — H&P (Signed)
Katelyn Snow is a 36 y.o. female G1P0 at 35+ with short cervix - decreased from 2.7-1.7cm.  Unable to tolerate progesterone.  D/W pt r/b/a of cervical cerclage and process.  Pregnancy complicated by maternal HTN, twin pregnancy.  Pregnancy after femara and timed IC.  Pt also AMA, has Bthal, alpha thal and sickle cell trait.  FOB tested negative.    US reveals ?polyp in upper cervix.    OB History    Gravida  1   Para      Term      Preterm      AB      Living        SAB      TAB      Ectopic      Multiple      Live Births            G1 present, girl boy twins  Last pap HR HPV pos, Pap WNL Remote h/o Chl  Past Medical History:  Diagnosis Date  . Blood transfusion without reported diagnosis   . Headache(784.0)   . Hypertension   obese  Past Surgical History:  Procedure Laterality Date  . NO PAST SURGERIES     Family History: family history includes Diabetes in her maternal grandmother; Hypertension in her father, maternal grandmother, and mother. Social History:  reports that she has never smoked. She has never used smokeless tobacco. She reports that she drinks about 0.6 oz of alcohol per week. She reports that she does not use drugs. 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   HENT: Negative.   Eyes: Negative.   Respiratory: Negative.   Cardiovascular: Negative.   Gastrointestinal: Negative.   Genitourinary: Negative.   Musculoskeletal: Negative.   Skin: Negative.   Neurological: Negative.   Psychiatric/Behavioral: Negative.    History   Height   (1.575 m), weight 89.4 kg (197 lb), last menstrual period 09/20/2017. Maternal Exam:  Abdomen: Patient reports no abdominal tenderness. Fundal height is app for gestation.    Introitus: Normal vulva. Normal vagina.    Physical Exam  Constitutional: She is oriented to person, place, and time. She appears well-developed and well-nourished.  HENT:  Head: Normocephalic and atraumatic.  Cardiovascular: Normal rate and regular rhythm.  Respiratory: Effort normal and breath sounds normal. No respiratory distress. She has no wheezes.  GI: Soft. Bowel sounds are normal. She exhibits no distension. There is no tenderness.  Musculoskeletal: Normal range of motion.  Neurological: She is alert and oriented to person, place, and time.  Skin: Skin is warm and dry.  Psychiatric: She has a normal mood and affect. Her behavior is normal.    Prenatal labs: ABO, Rh:O+   Antibody:  neg Rubella:  immune RPR:   NR HBsAg:   NR HIV:   neg GBS:   unknown  Hgb 11/Plt 408/SMA Pos/FOB neg; Ur Cx neg/GC neg/Chl neg/nl first tri scr/Varicella immune/  EDC 06/27/18  Assessment/Plan: 36yo G1 at 18+ with short cervix for cervical cerclage D/w pt r/b/a and process Will proceed   Katelyn Snow 01/24/2018, 1:14 PM

## 2018-01-25 ENCOUNTER — Other Ambulatory Visit: Payer: Self-pay

## 2018-01-25 ENCOUNTER — Encounter (HOSPITAL_COMMUNITY): Payer: Self-pay | Admitting: *Deleted

## 2018-01-25 ENCOUNTER — Ambulatory Visit (HOSPITAL_COMMUNITY): Payer: BLUE CROSS/BLUE SHIELD | Admitting: Anesthesiology

## 2018-01-25 ENCOUNTER — Ambulatory Visit (HOSPITAL_COMMUNITY)
Admission: RE | Admit: 2018-01-25 | Discharge: 2018-01-25 | Disposition: A | Discharge status: Home / Self Care | Payer: BLUE CROSS/BLUE SHIELD | Source: Ambulatory Visit | Attending: Obstetrics and Gynecology | Admitting: Obstetrics and Gynecology

## 2018-01-25 ENCOUNTER — Encounter (HOSPITAL_COMMUNITY)
Admission: RE | Disposition: A | Discharge status: Home / Self Care | Payer: Self-pay | Source: Ambulatory Visit | Attending: Obstetrics and Gynecology

## 2018-01-25 DIAGNOSIS — O99012 Anemia complicating pregnancy, second trimester: Secondary | ICD-10-CM | POA: Insufficient documentation

## 2018-01-25 DIAGNOSIS — O30002 Twin pregnancy, unspecified number of placenta and unspecified number of amniotic sacs, second trimester: Secondary | ICD-10-CM | POA: Diagnosis not present

## 2018-01-25 DIAGNOSIS — O26872 Cervical shortening, second trimester: Secondary | ICD-10-CM | POA: Diagnosis present

## 2018-01-25 DIAGNOSIS — O162 Unspecified maternal hypertension, second trimester: Secondary | ICD-10-CM | POA: Diagnosis not present

## 2018-01-25 DIAGNOSIS — O09512 Supervision of elderly primigravida, second trimester: Secondary | ICD-10-CM | POA: Insufficient documentation

## 2018-01-25 DIAGNOSIS — Z3A18 18 weeks gestation of pregnancy: Secondary | ICD-10-CM | POA: Insufficient documentation

## 2018-01-25 DIAGNOSIS — D573 Sickle-cell trait: Secondary | ICD-10-CM | POA: Diagnosis not present

## 2018-01-25 DIAGNOSIS — O3432 Maternal care for cervical incompetence, second trimester: Secondary | ICD-10-CM

## 2018-01-25 HISTORY — DX: Maternal care for cervical incompetence, second trimester: O34.32

## 2018-01-25 HISTORY — DX: Maternal care for cervical incompetence, second trimester: O26.872

## 2018-01-25 HISTORY — PX: CERVICAL CERCLAGE: SHX1329

## 2018-01-25 HISTORY — DX: Cervical shortening, second trimester: O26.872

## 2018-01-25 LAB — CBC
HCT: 34.1 % — ABNORMAL LOW (ref 36.0–46.0)
Hemoglobin: 11.1 g/dL — ABNORMAL LOW (ref 12.0–15.0)
MCH: 22.5 pg — ABNORMAL LOW (ref 26.0–34.0)
MCHC: 32.6 g/dL (ref 30.0–36.0)
MCV: 69 fL — ABNORMAL LOW (ref 78.0–100.0)
PLATELETS: 328 10*3/uL (ref 150–400)
RBC: 4.94 MIL/uL (ref 3.87–5.11)
RDW: 18.8 % — AB (ref 11.5–15.5)
WBC: 8.3 10*3/uL (ref 4.0–10.5)

## 2018-01-25 SURGERY — CERCLAGE, CERVIX, VAGINAL APPROACH
Anesthesia: Spinal

## 2018-01-25 MED ORDER — LACTATED RINGERS IV SOLN
INTRAVENOUS | Status: DC
  Administered 2018-01-25: 1000 mL via INTRAVENOUS
  Administered 2018-01-25 (×2): via INTRAVENOUS

## 2018-01-25 MED ORDER — PHENYLEPHRINE 40 MCG/ML (10ML) SYRINGE FOR IV PUSH (FOR BLOOD PRESSURE SUPPORT)
PREFILLED_SYRINGE | INTRAVENOUS | Status: AC
  Filled 2018-01-25: qty 20

## 2018-01-25 MED ORDER — FENTANYL CITRATE (PF) 100 MCG/2ML IJ SOLN
INTRAMUSCULAR | Status: AC
  Filled 2018-01-25: qty 2

## 2018-01-25 MED ORDER — DEXAMETHASONE SODIUM PHOSPHATE 10 MG/ML IJ SOLN
INTRAMUSCULAR | Status: AC
  Filled 2018-01-25: qty 1

## 2018-01-25 MED ORDER — OXYCODONE HCL 5 MG PO TABS: 5.0000 mg | ORAL_TABLET | Freq: Once | ORAL | Status: DC | PRN

## 2018-01-25 MED ORDER — ACETAMINOPHEN 160 MG/5ML PO SOLN
900.0000 mg | Freq: Once | ORAL | Status: AC
  Administered 2018-01-25: 900 mg via ORAL

## 2018-01-25 MED ORDER — ROCURONIUM BROMIDE 100 MG/10ML IV SOLN
INTRAVENOUS | Status: AC
  Filled 2018-01-25: qty 1

## 2018-01-25 MED ORDER — IBUPROFEN 600 MG PO TABS: 600.0000 mg | ORAL_TABLET | Freq: Three times a day (TID) | ORAL | 0 refills | Status: DC | PRN

## 2018-01-25 MED ORDER — PHENYLEPHRINE HCL 10 MG/ML IJ SOLN
INTRAMUSCULAR | Status: DC | PRN
Start: 2018-01-25 — End: 2018-01-25
  Administered 2018-01-25 (×10): 80 ug via INTRAVENOUS

## 2018-01-25 MED ORDER — PHENYLEPHRINE 8 MG IN D5W 100 ML (0.08MG/ML) PREMIX OPTIME
INJECTION | INTRAVENOUS | Status: DC | PRN
  Administered 2018-01-25: 50 ug/min via INTRAVENOUS

## 2018-01-25 MED ORDER — PROPOFOL 10 MG/ML IV BOLUS
INTRAVENOUS | Status: AC
  Filled 2018-01-25: qty 20

## 2018-01-25 MED ORDER — OXYCODONE HCL 5 MG/5ML PO SOLN: 5.0000 mg | Freq: Once | ORAL | Status: DC | PRN

## 2018-01-25 MED ORDER — ACETAMINOPHEN 160 MG/5ML PO SOLN
ORAL | Status: AC
  Filled 2018-01-25: qty 40.6

## 2018-01-25 MED ORDER — EPHEDRINE 5 MG/ML INJ
INTRAVENOUS | Status: AC
  Filled 2018-01-25: qty 10

## 2018-01-25 MED ORDER — ONDANSETRON HCL 4 MG/2ML IJ SOLN: 4.0000 mg | Freq: Four times a day (QID) | INTRAMUSCULAR | Status: DC | PRN

## 2018-01-25 MED ORDER — EPHEDRINE SULFATE 50 MG/ML IJ SOLN
INTRAMUSCULAR | Status: DC | PRN
  Administered 2018-01-25: 10 mg via INTRAVENOUS

## 2018-01-25 MED ORDER — FENTANYL CITRATE (PF) 100 MCG/2ML IJ SOLN: 25.0000 ug | INTRAMUSCULAR | Status: DC | PRN

## 2018-01-25 MED ORDER — SCOPOLAMINE 1 MG/3DAYS TD PT72
MEDICATED_PATCH | TRANSDERMAL | Status: AC
  Filled 2018-01-25: qty 1

## 2018-01-25 MED ORDER — LIDOCAINE HCL (CARDIAC) PF 100 MG/5ML IV SOSY
PREFILLED_SYRINGE | INTRAVENOUS | Status: AC
  Filled 2018-01-25: qty 5

## 2018-01-25 MED ORDER — LACTATED RINGERS IV SOLN
INTRAVENOUS | Status: DC
  Administered 2018-01-25: 13:00:00 via INTRAVENOUS

## 2018-01-25 SURGICAL SUPPLY — 16 items
CANISTER SUCT 3000ML PPV (MISCELLANEOUS) ×4 IMPLANT
GLOVE BIO SURGEON STRL SZ 6.5 (GLOVE) ×2 IMPLANT
GLOVE BIOGEL PI IND STRL 7.0 (GLOVE) ×1 IMPLANT
GLOVE BIOGEL PI INDICATOR 7.0 (GLOVE) ×1
GOWN STRL REUS W/TWL LRG LVL3 (GOWN DISPOSABLE) ×4 IMPLANT
NEEDLE MAYO CATGUT SZ4 (NEEDLE) ×2 IMPLANT
NS IRRIG 1000ML POUR BTL (IV SOLUTION) ×2 IMPLANT
PACK VAGINAL MINOR WOMEN LF (CUSTOM PROCEDURE TRAY) ×2 IMPLANT
PAD OB MATERNITY 4.3X12.25 (PERSONAL CARE ITEMS) ×2 IMPLANT
PAD PREP 24X48 CUFFED NSTRL (MISCELLANEOUS) ×2 IMPLANT
SUT PROLENE 1 CTX 30  8455H (SUTURE) ×2
SUT PROLENE 1 CTX 30 8455H (SUTURE) ×2 IMPLANT
TOWEL OR 17X24 6PK STRL BLUE (TOWEL DISPOSABLE) ×4 IMPLANT
TRAY FOLEY W/BAG SLVR 14FR (SET/KITS/TRAYS/PACK) ×2 IMPLANT
TUBING NON-CON 1/4 X 20 CONN (TUBING) ×2 IMPLANT
YANKAUER SUCT BULB TIP NO VENT (SUCTIONS) ×2 IMPLANT

## 2018-01-25 NOTE — Anesthesia Preprocedure Evaluation (Signed)
Anesthesia Evaluation  Patient identified by MRN, date of birth, ID band Patient awake    Reviewed: Allergy & Precautions, H&P , NPO status , Patient's Chart, lab work & pertinent test results  Airway Mallampati: II   Neck ROM: full    Dental   Pulmonary neg pulmonary ROS,    breath sounds clear to auscultation       Cardiovascular hypertension,  Rhythm:regular Rate:Normal     Neuro/Psych  Headaches,    GI/Hepatic   Endo/Other    Renal/GU      Musculoskeletal   Abdominal   Peds  Hematology   Anesthesia Other Findings   Reproductive/Obstetrics (+) Pregnancy                             Anesthesia Physical Anesthesia Plan  ASA: II  Anesthesia Plan: Spinal   Post-op Pain Management:    Induction: Intravenous  PONV Risk Score and Plan: 2 and Treatment may vary due to age or medical condition, Ondansetron and Dexamethasone  Airway Management Planned: Nasal Cannula  Additional Equipment:   Intra-op Plan:   Post-operative Plan:   Informed Consent: I have reviewed the patients History and Physical, chart, labs and discussed the procedure including the risks, benefits and alternatives for the proposed anesthesia with the patient or authorized representative who has indicated his/her understanding and acceptance.     Plan Discussed with: CRNA, Anesthesiologist and Surgeon  Anesthesia Plan Comments:         Anesthesia Quick Evaluation

## 2018-01-25 NOTE — Interval H&P Note (Signed)
History and Physical Interval Note:  01/25/2018 11:09 AM  Katelyn Snow  has presented today for surgery, with the diagnosis of short cervical length  The various methods of treatment have been discussed with the patient and family. After consideration of risks, benefits and other options for treatment, the patient has consented to  Procedure(s): CERCLAGE CERVICAL (N/A) as a surgical intervention .  The patient's history has been reviewed, patient examined, no change in status, stable for surgery.  I have reviewed the patient's chart and labs.  Questions were answered to the patient's satisfaction.     Carrieanne Kleen Bovard-Stuckert

## 2018-01-25 NOTE — Discharge Instructions (Signed)
Cervical Cerclage, Care After °This sheet gives you information about how to care for yourself after your procedure. Your health care provider may also give you more specific instructions. If you have problems or questions, contact your health care provider. °What can I expect after the procedure? °After your procedure, it is common to have: °· Cramping in your abdomen. °· Mucus discharge for several days. °· Painful urination (dysuria). °· Small drops of blood coming from your vagina (spotting). °Follow these instructions at home: °· Follow instructions from your health care provider about bed rest, if this applies. You may need to be on bed rest for up to 3 days. °· Take over-the-counter and prescription medicines only as told by your health care provider. °· Do not drive or use heavy machinery while taking prescription pain medicine. °· Keep track of your vaginal discharge and watch for any changes. If you notice changes, tell your health care provider. °· Avoid physical activities and exercise until your health care provider approves. Ask your health care provider what activities are safe for you. °· Until your health care provider approves: °¨ Do not douche. °¨ Do not have sexual intercourse. °· Keep all pre-birth (prenatal) visits and all follow-up visits as told by your health care provider. This is important. You will probably have weekly visits to have your cervix checked, and you may need an ultrasound. °Contact a health care provider if: °· You have abnormal or bad-smelling vaginal discharge, such as clots. °· You develop a rash on your skin. This may look like redness and swelling. °· You become light-headed or feel like you are going to faint. °· You have abdominal pain that does not get better with medicine. °· You have persistent nausea or vomiting. °Get help right away if: °· You have vaginal bleeding that is heavier or more frequent than spotting. °· You are leaking fluid or have a gush of fluid  from your vagina (your water breaks). °· You have a fever or chills. °· You faint. °· You have uterine contractions. These may feel like: °¨ A back ache. °¨ Lower abdominal pain. °¨ Mild cramps, similar to menstrual cramps. °¨ Tightening or pressure in your abdomen. °· You think that your baby is not moving as much as usual, or you cannot feel your baby move. °· You have chest pain. °· You have shortness of breath. °This information is not intended to replace advice given to you by your health care provider. Make sure you discuss any questions you have with your health care provider. °Document Released: 06/18/2013 Document Revised: 04/26/2016 Document Reviewed: 03/31/2016 °Elsevier Interactive Patient Education © 2017 [employer] ° °

## 2018-01-25 NOTE — Transfer of Care (Addendum)
Immediate Anesthesia Transfer of Care Note  Patient: Katelyn Snow  Procedure(s) Performed: CERCLAGE CERVICAL (N/A )  Patient Location: PACU  Anesthesia Type:Spinal  Level of Consciousness: awake, alert  and oriented  Airway & Oxygen Therapy: Patient Spontanous Breathing  Post-op Assessment: Report given to RN, Post -op Vital signs reviewed and stable and slowly weaning phenylephrine, currently at 20 mcg/min  Post vital signs: Reviewed  BP 124/74 HR 88 Resp 22 Sats 96% on RA   Last Vitals:  Vitals Value Taken Time  BP 124/74 01/25/2018  1:20 PM  Temp    Pulse 86 01/25/2018  1:22 PM  Resp 26 01/25/2018  1:22 PM  SpO2 100 % 01/25/2018  1:22 PM  Vitals shown include unvalidated device data.  Last Pain:  Vitals:   01/25/18 1041  TempSrc: Oral         Complications: No apparent anesthesia complications

## 2018-01-25 NOTE — Brief Op Note (Signed)
01/25/2018  1:28 PM  PATIENT:  Katelyn Snow  37 y.o. female  PRE-OPERATIVE DIAGNOSIS:  short cervical length  POST-OPERATIVE DIAGNOSIS:  short cervical length  PROCEDURE:  Procedure(s): CERCLAGE CERVICAL (N/A) McDonald  SURGEON:  Surgeon(s) and Role:    * Bovard-Stuckert, Amerie Beaumont, MD - Primary  ANESTHESIA:   spinal  EBL:  minimal  BLOOD ADMINISTERED:none  DRAINS: Urinary Catheter (Foley)   LOCAL MEDICATIONS USED:  NONE  SPECIMEN:  No Specimen  DISPOSITION OF SPECIMEN:  N/A  COUNTS:  YES  TOURNIQUET:  * No tourniquets in log *  DICTATION: .Other Dictation: Dictation Number [id number]  PLAN OF CARE: Discharge to home after PACU  PATIENT DISPOSITION:  PACU - hemodynamically stable.   Delay start of Pharmacological VTE agent (>24hrs) due to surgical blood loss or risk of bleeding: not applicable

## 2018-01-25 NOTE — Op Note (Signed)
[name], [name] MEDICAL RECORD [id number] ACCOUNT [id number] DATE OF BIRTH:1981/05/29 FACILITY: [facility] LOCATION: [facility] PHYSICIAN:Anesia Blackwell Hinton Rao, MD  OPERATIVE REPORT  DATE OF PROCEDURE:  01/25/2018  PREOPERATIVE DIAGNOSIS:  Short cervical length, intrauterine pregnancy with twins.  POSTOPERATIVE DIAGNOSIS:  Short cervical length, intrauterine pregnancy with twins.    PROCEDURE:  McDonald cervical cerclage with a single suture.  SURGEON:  Sherian Rein, MD  ANESTHESIA:  Spinal.  ESTIMATED BLOOD LOSS:  Minimal.  URINE OUTPUT:  Approximately 200 mL clear urine at the end of the procedure.  IV FLUIDS:  Per anesthesia notes.  COMPLICATIONS:  None.  PATHOLOGY:  None.  FINDINGS:  Given that at 16 weeks, the patient had a cervical length of 2.7, the cervical length was repeated at 18 weeks and was found to be decreased to 1.7 cm with a probable polyp in the upper part of her cervix causing some funneling, she was  consented for cervical cerclage.  Discussed with her risks, benefits and alternatives.  She too chooses to proceed.    DESCRIPTION OF PROCEDURE:  She was transported to the operating room where spinal anesthesia was placed and the level was adequate.  She was prepped and draped in a normal sterile fashion.  Her vagina was prepped with sterile water and a Foley catheter  was placed.  Her cervix was easily visualized using an open-sided speculum.  Using 0 Prolene a single suture was placed using a pursestring suture.  This was knotted and left long.  The bleeding was cleansed.  No active bleeding was noted.  The exam  revealed a closed cervix and a length of approximately 1.5 cm below the stitch.    The patient tolerated the procedure well.  Sponge, lap and needle counts were correct x2 per the operating staff.  AN/NUANCE  D:01/25/2018 T:01/25/2018 JOB:[id number]

## 2018-01-26 MED ORDER — BUPIVACAINE IN DEXTROSE 0.75-8.25 % IT SOLN
INTRATHECAL | Status: DC | PRN
  Administered 2018-01-25: 1.6 mL via INTRATHECAL

## 2018-01-26 NOTE — Anesthesia Postprocedure Evaluation (Signed)
Anesthesia Post Note  Patient: Katelyn Snow  Procedure(s) Performed: CERCLAGE CERVICAL (N/A )     Patient location during evaluation: PACU Anesthesia Type: Spinal Level of consciousness: oriented and awake and alert Pain management: pain level controlled Vital Signs Assessment: post-procedure vital signs reviewed and stable Respiratory status: spontaneous breathing, respiratory function stable and patient connected to nasal cannula oxygen Cardiovascular status: blood pressure returned to baseline and stable Postop Assessment: no headache, no backache and no apparent nausea or vomiting Anesthetic complications: no    Last Vitals:  Vitals:   01/25/18 1530 01/25/18 1645  BP: 127/79 128/85  Pulse: (!) 127 (!) 106  Resp: (!) 26 20  Temp:  36.5 C  SpO2: 100% 100%    Last Pain:  Vitals:   01/25/18 1645  TempSrc:   PainSc: 0-No pain   Pain Goal:                 Lailoni Baquera S

## 2018-01-26 NOTE — Anesthesia Procedure Notes (Signed)
Spinal  Patient location during procedure: OR Start time: 01/25/2018 12:40 PM End time: 01/25/2018 12:43 PM Staffing Anesthesiologist: Achille Rich, MD Performed: anesthesiologist  Preanesthetic Checklist Completed: patient identified, surgical consent, pre-op evaluation, timeout performed, IV checked, risks and benefits discussed and monitors and equipment checked Spinal Block Patient position: sitting Prep: DuraPrep Patient monitoring: cardiac monitor, continuous pulse ox and blood pressure Approach: midline Location: L3-4 Injection technique: single-shot Needle Needle type: Pencan  Needle gauge: 24 G Needle length: 9 cm Assessment Sensory level: T10 Additional Notes Functioning IV was confirmed and monitors were applied. Sterile prep and drape, including hand hygiene and sterile gloves were used. The patient was positioned and the spine was prepped. The skin was anesthetized with lidocaine.  Free flow of clear CSF was obtained prior to injecting local anesthetic into the CSF.  The spinal needle aspirated freely following injection.  The needle was carefully withdrawn.  The patient tolerated the procedure well.

## 2018-01-27 ENCOUNTER — Encounter (HOSPITAL_COMMUNITY): Payer: Self-pay | Admitting: Obstetrics and Gynecology

## 2018-02-11 ENCOUNTER — Other Ambulatory Visit: Payer: Self-pay

## 2018-02-11 ENCOUNTER — Encounter (HOSPITAL_COMMUNITY): Payer: Self-pay

## 2018-02-11 ENCOUNTER — Inpatient Hospital Stay (HOSPITAL_COMMUNITY)
Admission: AD | Admit: 2018-02-11 | Discharge: 2018-02-11 | Disposition: A | Discharge status: Home / Self Care | Payer: BLUE CROSS/BLUE SHIELD | Source: Ambulatory Visit | Attending: Obstetrics and Gynecology | Admitting: Obstetrics and Gynecology

## 2018-02-11 DIAGNOSIS — Z3A20 20 weeks gestation of pregnancy: Secondary | ICD-10-CM | POA: Insufficient documentation

## 2018-02-11 DIAGNOSIS — O30042 Twin pregnancy, dichorionic/diamniotic, second trimester: Secondary | ICD-10-CM | POA: Insufficient documentation

## 2018-02-11 DIAGNOSIS — O26872 Cervical shortening, second trimester: Secondary | ICD-10-CM | POA: Diagnosis not present

## 2018-02-11 DIAGNOSIS — O162 Unspecified maternal hypertension, second trimester: Secondary | ICD-10-CM | POA: Diagnosis not present

## 2018-02-11 DIAGNOSIS — R109 Unspecified abdominal pain: Secondary | ICD-10-CM | POA: Diagnosis present

## 2018-02-11 DIAGNOSIS — O3432 Maternal care for cervical incompetence, second trimester: Secondary | ICD-10-CM

## 2018-02-11 DIAGNOSIS — O26892 Other specified pregnancy related conditions, second trimester: Secondary | ICD-10-CM | POA: Insufficient documentation

## 2018-02-11 DIAGNOSIS — K529 Noninfective gastroenteritis and colitis, unspecified: Secondary | ICD-10-CM | POA: Insufficient documentation

## 2018-02-11 LAB — COMPREHENSIVE METABOLIC PANEL
ALBUMIN: 3.2 g/dL — AB (ref 3.5–5.0)
ALT: 11 U/L — ABNORMAL LOW (ref 14–54)
ANION GAP: 9 (ref 5–15)
AST: 16 U/L (ref 15–41)
Alkaline Phosphatase: 53 U/L (ref 38–126)
BUN: 5 mg/dL — ABNORMAL LOW (ref 6–20)
CALCIUM: 9.3 mg/dL (ref 8.9–10.3)
CHLORIDE: 105 mmol/L (ref 101–111)
CO2: 20 mmol/L — AB (ref 22–32)
Creatinine, Ser: 0.73 mg/dL (ref 0.44–1.00)
GFR calc non Af Amer: >60 mL/min (ref >60)
Glucose, Bld: 97 mg/dL (ref 65–99)
POTASSIUM: 3.9 mmol/L (ref 3.5–5.1)
SODIUM: 134 mmol/L — AB (ref 135–145)
Total Bilirubin: 0.2 mg/dL — ABNORMAL LOW (ref 0.3–1.2)
Total Protein: 7.1 g/dL (ref 6.5–8.1)

## 2018-02-11 LAB — CBC
HCT: 32 % — ABNORMAL LOW (ref 36.0–46.0)
Hemoglobin: 10.6 g/dL — ABNORMAL LOW (ref 12.0–15.0)
MCH: 22.8 pg — ABNORMAL LOW (ref 26.0–34.0)
MCHC: 33.1 g/dL (ref 30.0–36.0)
MCV: 68.8 fL — ABNORMAL LOW (ref 78.0–100.0)
PLATELETS: 320 10*3/uL (ref 150–400)
RBC: 4.65 MIL/uL (ref 3.87–5.11)
RDW: 19 % — AB (ref 11.5–15.5)
WBC: 8.1 10*3/uL (ref 4.0–10.5)

## 2018-02-11 LAB — URINALYSIS, ROUTINE W REFLEX MICROSCOPIC
BILIRUBIN URINE: NEGATIVE
GLUCOSE, UA: NEGATIVE mg/dL
Hgb urine dipstick: NEGATIVE
KETONES UR: NEGATIVE mg/dL
LEUKOCYTES UA: NEGATIVE
Nitrite: NEGATIVE
PH: 8 (ref 5.0–8.0)
PROTEIN: NEGATIVE mg/dL
Specific Gravity, Urine: 1.011 (ref 1.005–1.030)

## 2018-02-11 LAB — LIPASE, BLOOD: Lipase: 41 U/L (ref 11–51)

## 2018-02-11 MED ORDER — MORPHINE SULFATE (PF) 4 MG/ML IV SOLN
4.0000 mg | Freq: Once | INTRAVENOUS | Status: AC
  Administered 2018-02-11: 4 mg via INTRAVENOUS
  Filled 2018-02-11: qty 1

## 2018-02-11 MED ORDER — LACTATED RINGERS IV BOLUS
1000.0000 mL | Freq: Once | INTRAVENOUS | Status: AC
  Administered 2018-02-11: 1000 mL via INTRAVENOUS

## 2018-02-11 MED ORDER — FAMOTIDINE IN NACL 20-0.9 MG/50ML-% IV SOLN
20.0000 mg | Freq: Once | INTRAVENOUS | Status: AC
  Administered 2018-02-11: 20 mg via INTRAVENOUS
  Filled 2018-02-11: qty 50

## 2018-02-11 NOTE — MAU Provider Note (Addendum)
Chief Complaint: Abdominal Pain and Emesis   First Provider Initiated Contact with Patient 02/11/18 2018      SUBJECTIVE HPI: Katelyn Snow is a 37 y.o. G1P0 at [redacted]w[redacted]d by LMP complicated by maternal HTN, di/di twin gestation, who presents to maternity admissions via EMS for left lower abdominal pain and n/v with onset at 6 pm today. She reports the pain is constant, lower in her abdomen and associated with pelvic pressure when standing and with nausea and vomiting x 2 episodes.  She also reports heartburn. She denies any constipation or diarrhea but reports some loose/soft stools today only.  Her husband reports that they ate barbeque today and his stomach is not 100% but he denies any pain or vomiting.  She has not tried any treatments. There are no other associated symptoms.  She denies vaginal bleeding, urinary symptoms, or fever/chills.   HPI  Past Medical History:  Diagnosis Date  . Blood transfusion without reported diagnosis   . Headache(784.0)    migrains  . Hypertension   . Short cervix with cervical cerclage in second trimester, antepartum 01/25/2018   Past Surgical History:  Procedure Laterality Date  . CERVICAL CERCLAGE N/A 01/25/2018   Procedure: CERCLAGE CERVICAL;  Surgeon: Sherian Rein, MD;  Location: [facility];  Service: Gynecology;  Laterality: N/A;  . NO PAST SURGERIES     Social History   Socioeconomic History  . Marital status: Married    Spouse name: Not on file  . Number of children: Not on file  . Years of education: Not on file  . Highest education level: Not on file  Occupational History  . Not on file  Social Needs  . Financial resource strain: Not on file  . Food insecurity:    Worry: Not on file    Inability: Not on file  . Transportation needs:    Medical: Not on file    Non-medical: Not on file  Tobacco Use  . Smoking status: Never Smoker  . Smokeless tobacco: Never Used  Substance and Sexual Activity  . Alcohol use: Not Currently   Alcohol/week: 0.6 oz    Types: 1 Standard drinks or equivalent per week  . Drug use: No  . Sexual activity: Not Currently    Partners: Male  Lifestyle  . Physical activity:    Days per week: Not on file    Minutes per session: Not on file  . Stress: Not on file  Relationships  . Social connections:    Talks on phone: Not on file    Gets together: Not on file    Attends religious service: Not on file    Active member of club or organization: Not on file    Attends meetings of clubs or organizations: Not on file    Relationship status: Not on file  . Intimate partner violence:    Fear of current or ex partner: Not on file    Emotionally abused: Not on file    Physically abused: Not on file    Forced sexual activity: Not on file  Other Topics Concern  . Not on file  Social History Narrative  . Not on file   No current facility-administered medications on file prior to encounter.    Current Outpatient Medications on File Prior to Encounter  Medication Sig Dispense Refill  . acetaminophen (TYLENOL) 500 MG tablet Take 500 mg by mouth every 6 (six) hours as needed for moderate pain or headache.    . ibuprofen (ADVIL,MOTRIN)  600 MG tablet Take 1 tablet (600 mg total) by mouth every 8 (eight) hours as needed for moderate pain or cramping. 20 tablet 0  . Prenatal Vit-Fe Fumarate-FA (PRENATAL MULTIVITAMIN) TABS tablet Take 1 tablet by mouth 3 (three) times a week.     . progesterone 200 MG SUPP Place 200 mg vaginally at bedtime.     No Known Allergies  ROS:  Review of Systems  Constitutional: Negative for chills, fatigue and fever.  Respiratory: Negative for shortness of breath.   Cardiovascular: Negative for chest pain.  Gastrointestinal: Positive for abdominal pain, diarrhea and vomiting.  Genitourinary: Positive for pelvic pain. Negative for difficulty urinating, dysuria, flank pain, vaginal bleeding, vaginal discharge and vaginal pain.  Neurological: Negative for dizziness and  headaches.  Psychiatric/Behavioral: Negative.      I have reviewed patient's Past Medical Hx, Surgical Hx, Family Hx, Social Hx, medications and allergies.   Physical Exam   Patient Vitals for the past 24 hrs:  BP Temp Temp src Pulse Resp SpO2  02/11/18 2009 129/83 98.9 F (37.2 C) Oral 92 (!) 22 100 %   Constitutional: Well-developed, well-nourished female in no acute distress.  Cardiovascular: normal rate Respiratory: normal effort GI: Abd soft, non-tender. Pos BS x 4 MS: Extremities nontender, no edema, normal ROM Neurologic: Alert and oriented x 4.  GU: Neg CVAT.  PELVIC EXAM: Cervix pink, visually closed, without lesion, cerclage suture visible and wnl, scant white creamy discharge, vaginal walls and external genitalia normal  Dilation: Closed Effacement (%): Thick Station: Ballotable Exam by:: Sharen CounterLeftwich-Kirby, Shaakira Borrero, CNM  FHT 150x2 by doppler  LAB RESULTS Results for orders placed or performed during the hospital encounter of 02/11/18 (from the past 24 hour(s))  Urinalysis, Routine w reflex microscopic     Status: None   Collection Time: 02/11/18  8:43 PM  Result Value Ref Range   Color, Urine YELLOW YELLOW   APPearance CLEAR CLEAR   Specific Gravity, Urine 1.011 1.005 - 1.030   pH 8.0 5.0 - 8.0   Glucose, UA NEGATIVE NEGATIVE mg/dL   Hgb urine dipstick NEGATIVE NEGATIVE   Bilirubin Urine NEGATIVE NEGATIVE   Ketones, ur NEGATIVE NEGATIVE mg/dL   Protein, ur NEGATIVE NEGATIVE mg/dL   Nitrite NEGATIVE NEGATIVE   Leukocytes, UA NEGATIVE NEGATIVE  CBC     Status: Abnormal   Collection Time: 02/11/18  9:02 PM  Result Value Ref Range   WBC 8.1 4.0 - 10.5 K/uL   RBC 4.65 3.87 - 5.11 MIL/uL   Hemoglobin 10.6 (L) 12.0 - 15.0 g/dL   HCT 16.132.0 (L) 09.636.0 - 04.546.0 %   MCV 68.8 (L) 78.0 - 100.0 fL   MCH 22.8 (L) 26.0 - 34.0 pg   MCHC 33.1 30.0 - 36.0 g/dL   RDW 40.919.0 (H) 81.111.5 - 91.415.5 %   Platelets 320 150 - 400 K/uL  Comprehensive metabolic panel     Status: Abnormal    Collection Time: 02/11/18  9:02 PM  Result Value Ref Range   Sodium 134 (L) 135 - 145 mmol/L   Potassium 3.9 3.5 - 5.1 mmol/L   Chloride 105 101 - 111 mmol/L   CO2 20 (L) 22 - 32 mmol/L   Glucose, Bld 97 65 - 99 mg/dL   BUN 5 (L) 6 - 20 mg/dL   Creatinine, Ser 7.820.73 0.44 - 1.00 mg/dL   Calcium 9.3 8.9 - 95.610.3 mg/dL   Total Protein 7.1 6.5 - 8.1 g/dL   Albumin 3.2 (L) 3.5 - 5.0 g/dL  AST 16 15 - 41 U/L   ALT 11 (L) 14 - 54 U/L   Alkaline Phosphatase 53 38 - 126 U/L   Total Bilirubin 0.2 (L) 0.3 - 1.2 mg/dL   GFR calc non Af Amer >60 >60 mL/min   GFR calc Af Amer >60 >60 mL/min   Anion gap 9 5 - 15  Lipase, blood     Status: None   Collection Time: 02/11/18  9:02 PM  Result Value Ref Range   Lipase 41 11 - 51 U/L       IMAGING Pt informed that the ultrasound is considered a limited OB ultrasound and is not intended to be a complete ultrasound exam.  Patient also informed that the ultrasound is not being completed with the intent of assessing for fetal or placental anomalies or any pelvic abnormalities.  Explained that the purpose of today's ultrasound is to assess for  viability.  Patient acknowledges the purpose of the exam and the limitations of the study.    Limited OB US Date: 02/11/18 EDD :06/27/18  based on LMP Viability:  FHT detected x 2, active fetal movement x 2 No other abnormalities noted on [place]  MAU Management/MDM: Pt presented with severe constant pain but did not have acute abdomen, CBC and other labs without abnormality.   Cervix visually closed with speculum exam and closed/thick on digital exam, with cerclage in place and wnl No evidence of preterm labor or cervical shortening today IV fluids, Pepcid IV, and  Morphine 4 mg IV given in MAU with complete resolution of pain Pt husband with GI symptoms so this may be digestive, possibly food related gastroenteritis Consult Dr Jackelyn Knife with assessment and findings. D/C home with strict abdominal pain in pregnancy  precautions May try Pepcid or Zantac PO BID or PRN to treat heartburn    ASSESSMENT 1. Abdominal pain during pregnancy in second trimester   2. Gastroenteritis, acute   3. Short cervix with cervical cerclage in second trimester, antepartum     PLAN Discharge home Allergies as of 02/11/2018   No Known Allergies     Medication List    STOP taking these medications   ibuprofen 600 MG tablet Commonly known as:  MOTRIN IB     TAKE these medications   acetaminophen 500 MG tablet Commonly known as:  TYLENOL Take 500 mg by mouth every 6 (six) hours as needed for moderate pain or headache.   prenatal multivitamin Tabs tablet Take 1 tablet by mouth 3 (three) times a week.   progesterone 200 MG Supp Place 200 mg vaginally at bedtime.      Follow-up Information    [facility] Follow [facility].   Why:  As scheduled, return to MAU as needed for emergencies Contact information: [address]  [address] [place] [address] [phone]           Sharen Counter Certified Nurse-Midwife 02/11/2018  10:05 PM

## 2018-02-11 NOTE — MAU Note (Signed)
Pt presents to MAU via EMS for lower left abdominal pain that started this morning and has increased in intensity this afternoon. Pt has also had vomiting since the pain started. Pt denies VB and LOF. +FM

## 2018-03-20 ENCOUNTER — Encounter (HOSPITAL_COMMUNITY): Payer: Self-pay

## 2018-03-20 ENCOUNTER — Observation Stay (HOSPITAL_COMMUNITY)
Admission: AD | Admit: 2018-03-20 | Discharge: 2018-03-22 | Disposition: A | Discharge status: Home / Self Care | Payer: BLUE CROSS/BLUE SHIELD | Source: Ambulatory Visit | Attending: Obstetrics and Gynecology | Admitting: Obstetrics and Gynecology

## 2018-03-20 ENCOUNTER — Inpatient Hospital Stay (HOSPITAL_BASED_OUTPATIENT_CLINIC_OR_DEPARTMENT_OTHER): Payer: BLUE CROSS/BLUE SHIELD

## 2018-03-20 DIAGNOSIS — O3432 Maternal care for cervical incompetence, second trimester: Secondary | ICD-10-CM | POA: Diagnosis not present

## 2018-03-20 DIAGNOSIS — Z3686 Encounter for antenatal screening for cervical length: Secondary | ICD-10-CM

## 2018-03-20 DIAGNOSIS — O3433 Maternal care for cervical incompetence, third trimester: Secondary | ICD-10-CM | POA: Diagnosis not present

## 2018-03-20 DIAGNOSIS — O26872 Cervical shortening, second trimester: Secondary | ICD-10-CM

## 2018-03-20 DIAGNOSIS — Z3A25 25 weeks gestation of pregnancy: Secondary | ICD-10-CM

## 2018-03-20 DIAGNOSIS — O26873 Cervical shortening, third trimester: Secondary | ICD-10-CM | POA: Diagnosis not present

## 2018-03-20 DIAGNOSIS — O418X21 Other specified disorders of amniotic fluid and membranes, second trimester, fetus 1: Secondary | ICD-10-CM | POA: Diagnosis present

## 2018-03-20 DIAGNOSIS — O10913 Unspecified pre-existing hypertension complicating pregnancy, third trimester: Secondary | ICD-10-CM | POA: Diagnosis not present

## 2018-03-20 DIAGNOSIS — O10012 Pre-existing essential hypertension complicating pregnancy, second trimester: Secondary | ICD-10-CM | POA: Diagnosis not present

## 2018-03-20 DIAGNOSIS — Z3A26 26 weeks gestation of pregnancy: Secondary | ICD-10-CM | POA: Insufficient documentation

## 2018-03-20 DIAGNOSIS — O42919 Preterm premature rupture of membranes, unspecified as to length of time between rupture and onset of labor, unspecified trimester: Secondary | ICD-10-CM

## 2018-03-20 DIAGNOSIS — O4702 False labor before 37 completed weeks of gestation, second trimester: Secondary | ICD-10-CM | POA: Diagnosis present

## 2018-03-20 LAB — AMNISURE RUPTURE OF MEMBRANE (ROM) NOT AT ARMC: Amnisure ROM: POSITIVE

## 2018-03-20 LAB — URINALYSIS, ROUTINE W REFLEX MICROSCOPIC
Bilirubin Urine: NEGATIVE
Glucose, UA: NEGATIVE mg/dL
Hgb urine dipstick: NEGATIVE
Ketones, ur: NEGATIVE mg/dL
Nitrite: NEGATIVE
Protein, ur: NEGATIVE mg/dL
Specific Gravity, Urine: 1.013 (ref 1.005–1.030)
pH: 6 (ref 5.0–8.0)

## 2018-03-20 LAB — WET PREP, GENITAL
Clue Cells Wet Prep HPF POC: NONE SEEN
Sperm: NONE SEEN
Trich, Wet Prep: NONE SEEN
Yeast Wet Prep HPF POC: NONE SEEN

## 2018-03-20 LAB — POCT FERN TEST: POCT Fern Test: NEGATIVE

## 2018-03-20 MED ORDER — NIFEDIPINE 10 MG PO CAPS
10.0000 mg | ORAL_CAPSULE | ORAL | Status: AC | PRN
  Administered 2018-03-21 – 2018-03-22 (×4): 10 mg via ORAL
  Filled 2018-03-20 (×3): qty 1

## 2018-03-20 NOTE — MAU Note (Signed)
Pt states she has noticed her panties are wet for a few days. States she has had to change her panties several times over the past few days. Pt denies vaginal bleeding. Pt reports some braxton hicks that she has not really timed. Pt reports good fetal movement.

## 2018-03-21 ENCOUNTER — Observation Stay (HOSPITAL_COMMUNITY): Payer: BLUE CROSS/BLUE SHIELD

## 2018-03-21 ENCOUNTER — Other Ambulatory Visit: Payer: Self-pay

## 2018-03-21 DIAGNOSIS — Z3686 Encounter for antenatal screening for cervical length: Secondary | ICD-10-CM

## 2018-03-21 DIAGNOSIS — O3433 Maternal care for cervical incompetence, third trimester: Secondary | ICD-10-CM

## 2018-03-21 DIAGNOSIS — O10913 Unspecified pre-existing hypertension complicating pregnancy, third trimester: Secondary | ICD-10-CM

## 2018-03-21 DIAGNOSIS — O4702 False labor before 37 completed weeks of gestation, second trimester: Secondary | ICD-10-CM | POA: Diagnosis not present

## 2018-03-21 DIAGNOSIS — Z3A26 26 weeks gestation of pregnancy: Secondary | ICD-10-CM | POA: Diagnosis not present

## 2018-03-21 DIAGNOSIS — O26873 Cervical shortening, third trimester: Secondary | ICD-10-CM

## 2018-03-21 LAB — CBC
HCT: 32 % — ABNORMAL LOW (ref 36.0–46.0)
HEMOGLOBIN: 10.8 g/dL — AB (ref 12.0–15.0)
MCH: 23.1 pg — ABNORMAL LOW (ref 26.0–34.0)
MCHC: 33.8 g/dL (ref 30.0–36.0)
MCV: 68.5 fL — ABNORMAL LOW (ref 78.0–100.0)
Platelets: 374 10*3/uL (ref 150–400)
RBC: 4.67 MIL/uL (ref 3.87–5.11)
RDW: 19.5 % — ABNORMAL HIGH (ref 11.5–15.5)
WBC: 11.1 10*3/uL — AB (ref 4.0–10.5)

## 2018-03-21 LAB — TYPE AND SCREEN
ABO/RH(D): O POS
Antibody Screen: NEGATIVE

## 2018-03-21 MED ORDER — ACETAMINOPHEN 325 MG PO TABS
650.0000 mg | ORAL_TABLET | ORAL | Status: DC | PRN
  Administered 2018-03-21 – 2018-03-22 (×2): 650 mg via ORAL
  Filled 2018-03-21 (×2): qty 2

## 2018-03-21 MED ORDER — LACTATED RINGERS IV SOLN
INTRAVENOUS | Status: DC
  Administered 2018-03-21 – 2018-03-22 (×3): via INTRAVENOUS

## 2018-03-21 MED ORDER — CYCLOBENZAPRINE HCL 10 MG PO TABS
10.0000 mg | ORAL_TABLET | Freq: Three times a day (TID) | ORAL | Status: DC | PRN
  Filled 2018-03-21: qty 1

## 2018-03-21 MED ORDER — LACTATED RINGERS IV BOLUS
1000.0000 mL | Freq: Once | INTRAVENOUS | Status: AC
  Administered 2018-03-21: 1000 mL via INTRAVENOUS

## 2018-03-21 MED ORDER — PRENATAL MULTIVITAMIN CH
1.0000 | ORAL_TABLET | Freq: Every day | ORAL | Status: DC
  Filled 2018-03-21: qty 1

## 2018-03-21 MED ORDER — NIFEDIPINE 10 MG PO CAPS
10.0000 mg | ORAL_CAPSULE | Freq: Four times a day (QID) | ORAL | Status: DC
  Administered 2018-03-21 – 2018-03-22 (×5): 10 mg via ORAL
  Filled 2018-03-21 (×6): qty 1

## 2018-03-21 MED ORDER — SODIUM CHLORIDE 0.9 % IV SOLN
2.0000 g | Freq: Four times a day (QID) | INTRAVENOUS | Status: DC
  Administered 2018-03-21 – 2018-03-22 (×5): 2 g via INTRAVENOUS
  Filled 2018-03-21 (×2): qty 2
  Filled 2018-03-21 (×2): qty 2000
  Filled 2018-03-21 (×2): qty 2
  Filled 2018-03-21: qty 2000
  Filled 2018-03-21: qty 2

## 2018-03-21 MED ORDER — ZOLPIDEM TARTRATE 5 MG PO TABS
5.0000 mg | ORAL_TABLET | Freq: Every evening | ORAL | Status: DC | PRN
  Administered 2018-03-21 – 2018-03-22 (×2): 5 mg via ORAL
  Filled 2018-03-21 (×2): qty 1

## 2018-03-21 MED ORDER — AZITHROMYCIN 250 MG PO TABS
500.0000 mg | ORAL_TABLET | Freq: Every day | ORAL | Status: DC
  Administered 2018-03-21 – 2018-03-22 (×2): 500 mg via ORAL
  Filled 2018-03-21 (×3): qty 2

## 2018-03-21 MED ORDER — FAMOTIDINE 20 MG PO TABS
40.0000 mg | ORAL_TABLET | Freq: Every day | ORAL | Status: DC
  Administered 2018-03-21 – 2018-03-22 (×2): 40 mg via ORAL
  Filled 2018-03-21 (×2): qty 2

## 2018-03-21 MED ORDER — BUTORPHANOL TARTRATE 1 MG/ML IJ SOLN
1.0000 mg | Freq: Once | INTRAMUSCULAR | Status: AC
  Administered 2018-03-21: 1 mg via INTRAVENOUS
  Filled 2018-03-21: qty 1

## 2018-03-21 MED ORDER — CALCIUM CARBONATE ANTACID 500 MG PO CHEW
2.0000 | CHEWABLE_TABLET | ORAL | Status: DC | PRN
  Filled 2018-03-21: qty 2

## 2018-03-21 MED ORDER — DOCUSATE SODIUM 100 MG PO CAPS
100.0000 mg | ORAL_CAPSULE | Freq: Every day | ORAL | Status: DC
  Filled 2018-03-21 (×2): qty 1

## 2018-03-21 MED ORDER — BETAMETHASONE SOD PHOS & ACET 6 (3-3) MG/ML IJ SUSP
12.0000 mg | INTRAMUSCULAR | Status: AC
  Administered 2018-03-21 – 2018-03-22 (×2): 12 mg via INTRAMUSCULAR
  Filled 2018-03-21 (×2): qty 2

## 2018-03-21 MED ORDER — NIFEDIPINE 10 MG PO CAPS: 20.0000 mg | ORAL_CAPSULE | Freq: Once | ORAL | Status: DC

## 2018-03-21 NOTE — Progress Notes (Signed)
Patient ID: Katelyn Snow, female   DOB: 01-02-1981, 37 y.o.   MRN: [id number] Pt reports reflux/gas pain otherwise feels contractions better today over yesterday. +fms, no lof noted. In good spirits VSS No definite contractions noted on TOCO   A/P: IVF di/di twins at [redacted]W[redacted]D with possible PPROM and preterm ctx with cerclage for short cervix  Plan: IVF di/di twins at [redacted]W[redacted]D with possible PPROM and preterm ctx with cerclage for short cervix        Ok to start on pepcid        Scheduled for US this afternoon at 1pm        Amp/Zithro for prophylaxis in case of latency        BMZ second dose due tomorrow am        Continous toco

## 2018-03-21 NOTE — Progress Notes (Signed)
Patient ID: Katelyn Snow, female   DOB: Apr 26, 1981, 37 y.o.   MRN: [id number] Pt reports no complaints at this time. Rare strong cramps but still improved with procardia. No leakage  Reviewed US result findings with her:  Concordant di-di twins; baby A vertex/Baby B transverse; nl afi for both, cervix not well visualized due to GA TOCO - no contractions SVE deferred  A/P: IVF concordant di-di twins at [redacted]W[redacted]D with positive amnisure but normal afi x2, preterm contractions with cerclage and shortened cervix, cHTN           - continue amp/zithro till second dose of celestone ( due at 0300am 7/12) then if no furhter leakage by am consider discontinuing            - Continue continuous toco            - discussed possible discharge to home if no signs of rupture and cervix stable. Pt also understands that if signs of rupture or imminent delivery, will stay till delivery complete ( possibly at 34 weeks)

## 2018-03-21 NOTE — H&P (Signed)
HPI: Katelyn Snow is a 37 y.o. G1P0 at 59w0dwho presents to maternity admissions reporting possible rupture of membranes.  Patient reports leaking of fluid for 3 days, reports she has to change underwear several times.  She reports leaking of fluid is associated with Braxton Hicks contraction.  She reports Braxton Hicks contractions has been occurring for the last 3 days and has gotten worse since yesterday she rates contractions 7 out of 10 and reports she cannot talk during contraction. She reports good fetal movement, she denies vaginal bleeding.  This pregnancy is complicated by short cervix with cerclage in place, chronic hypertension, and multiple gestation.    Overnight her ctx improved with PO Procardia, but she had significant back pain that radiated around to abdomen that required one dose of IV Stadol, having more back pain currently.  No significant fluid leakage since admission.  Past Medical History:     Past Medical History:  Diagnosis Date  . Blood transfusion without reported diagnosis   . Headache(784.0)    migrains  . Hypertension   . Short cervix with cervical cerclage in second trimester, antepartum 01/25/2018    Past obstetric history:                 OB History  Gravida Para Term Preterm AB Living  1            SAB TAB Ectopic Multiple Live Births                     # Outcome Date GA Lbr Len/2nd Weight Sex Delivery Anes PTL Lv  1 Current             Past Surgical History:      Past Surgical History:  Procedure Laterality Date  . CERVICAL CERCLAGE N/A 01/25/2018   Procedure: CERCLAGE CERVICAL;  Surgeon: Sherian Rein, MD;  Location: [facility];  Service: Gynecology;  Laterality: N/A;  . NO PAST SURGERIES      Family History:      Family History  Problem Relation Age of Onset  . Hypertension Mother   . Hypertension Father   . Hypertension Maternal Grandmother   . Diabetes Maternal Grandmother     Social  History: Social History   Tobacco Use  . Smoking status: Never Smoker  . Smokeless tobacco: Never Used  Substance Use Topics  . Alcohol use: Not Currently    Alcohol/week: 0.6 oz    Types: 1 Standard drinks or equivalent per week  . Drug use: No    Allergies: No Known Allergies  Meds:  Medications Prior to Admission  Medication Sig Dispense Refill Last Dose  . acetaminophen (TYLENOL) 500 MG tablet Take 500 mg by mouth every 6 (six) hours as needed for moderate pain or headache.   01/24/2018 at 0400  . Prenatal Vit-Fe Fumarate-FA (PRENATAL MULTIVITAMIN) TABS tablet Take 1 tablet by mouth 3 (three) times a week.    Past Week at Unknown time  . progesterone 200 MG SUPP Place 200 mg vaginally at bedtime.   Past Week at Unknown time    ROS:  Review of Systems  Constitutional: Negative.   Respiratory: Negative.   Cardiovascular: Negative.   Gastrointestinal: Positive for abdominal pain. Negative for constipation, diarrhea, nausea and vomiting.  Genitourinary: Positive for vaginal discharge. Negative for difficulty urinating, dysuria, frequency, pelvic pain, urgency and vaginal bleeding.   I have reviewed patient's Past Medical Hx, Surgical Hx, Family Hx, Social Hx, medications and allergies.  Physical Exam   Patient Vitals for the past 24 hrs:  BP Temp Temp src Pulse Resp SpO2 Height Weight  03/21/18 0430 130/75 98.4 F (36.9 C) Oral 99 16 99 % - -  03/21/18 0130 114/65 98 F (36.7 C) - (!) 125 18 100 % - -  03/21/18 0100 (!) 141/83 98 F (36.7 C) Oral (!) 107 18 100 % - -  03/21/18 0038 131/85 - - - - - - -  03/20/18 2253 (!) 139/95 - - 97 - - - -  03/20/18 2124 (!) 150/94 98.7 F (37.1 C) Oral (!) 106 18 100 % 5\' 2"  (1.575 m) 200 lb (90.7 kg)   Constitutional: Well-developed, well-nourished female in no acute distress.  Cardiovascular: normal rate Respiratory: normal effort GI: Abd soft, non-tender, gravid appropriate for gestational age.  MS:  Extremities nontender, no edema, normal ROM Neurologic: Alert and oriented x 4.  GU: Neg CVAT.  PELVIC EXAM: Cervix pink, visually closed- cerclage in place, without lesion, small amount of clear watery discharge pooling into vaginal, vaginal walls and external genitalia normal  Dilation: Closed(cerclage in place) Cervical Position: Posterior  FHT:  Baseline 140 , moderate variability, accelerations present, no decelerations Contractions: q 3-5 mins/ mild by palpation    Labs: LabResultsLast24Hours       Results for orders placed or performed during the hospital encounter of 03/20/18 (from the past 24 hour(s))  Urinalysis, Routine w reflex microscopic     Status: Abnormal   Collection Time: 03/20/18 10:02 PM  Result Value Ref Range   Color, Urine YELLOW YELLOW   APPearance CLEAR CLEAR   Specific Gravity, Urine 1.013 1.005 - 1.030   pH 6.0 5.0 - 8.0   Glucose, UA NEGATIVE NEGATIVE mg/dL   Hgb urine dipstick NEGATIVE NEGATIVE   Bilirubin Urine NEGATIVE NEGATIVE   Ketones, ur NEGATIVE NEGATIVE mg/dL   Protein, ur NEGATIVE NEGATIVE mg/dL   Nitrite NEGATIVE NEGATIVE   Leukocytes, UA SMALL (A) NEGATIVE   RBC / HPF 0-5 0 - 5 RBC/hpf   WBC, UA 6-10 0 - 5 WBC/hpf   Bacteria, UA RARE (A) NONE SEEN   Squamous Epithelial / LPF 0-5 0 - 5   Mucus PRESENT   Amnisure rupture of membrane (rom)not at [facility]     Status: None   Collection Time: 03/20/18 10:35 PM  Result Value Ref Range   Amnisure ROM POSITIVE   Wet prep, genital     Status: Abnormal   Collection Time: 03/20/18 10:38 PM  Result Value Ref Range   Yeast Wet Prep HPF POC NONE SEEN NONE SEEN   Trich, Wet Prep NONE SEEN NONE SEEN   Clue Cells Wet Prep HPF POC NONE SEEN NONE SEEN   WBC, Wet Prep HPF POC MODERATE (A) NONE SEEN   Sperm NONE SEEN   Fern Test     Status: None   Collection Time: 03/20/18 11:01 PM  Result Value Ref Range   POCT Fern Test Negative = intact amniotic membranes       Imaging:  Ultrasound shows di/di twin gestation with fetus a largest pocket of amniotic fluid being 5.76 and fetus B largest pocket of amniotic fluid being 5.08cm.  No measurable cervix is noted and cerclage is visualized on ultrasound   A/P: IVF di/di twins at [redacted]W[redacted]D with possible PPROM and preterm ctx with cerclage for short cervix.  Crist Fat was neg, Amnisure was positive, but normal AFV both babies.  She is on procardia, having irregular ctx.  Having severe back pain off and on.  I am not exactly sure of the source of her pain, but it could just be back spasms.  Will try Flexeril and heat, continue procardia.  Will monitor closely for leaking fluid, has not had significant fluid leakage since admission.  Will repeat u/s this afternoon to recheck fluid levels.  I will start Amp and Zithromax for latency in case she does have PPROM.  She got first dose of betamethasone early this morning, for second dose 24 hrs later.  Will need to monitor for ctx as will need cerclage removed.

## 2018-03-21 NOTE — MAU Provider Note (Addendum)
Chief Complaint:  Rupture of Membranes   First Provider Initiated Contact with Patient 03/20/18 2204      HPI: Katelyn Snow is a 37 y.o. G1P0 at 33w0dwho presents to maternity admissions reporting possible rupture of membranes.  Patient reports leaking of fluid for 3 days, reports she has to change underwear several times.  She reports leaking of fluid is associated with Braxton Hicks contraction.  She reports Braxton Hicks contractions has been occurring for the last 3 days and has gotten worse since yesterday she rates contractions 7 out of 10 and reports she cannot talk during contraction. She reports good fetal movement, she denies vaginal bleeding.  This pregnancy is complicated by short cervix with cerclage in place, chronic hypertension, and multiple gestation.    Past Medical History: Past Medical History:  Diagnosis Date  . Blood transfusion without reported diagnosis   . Headache(784.0)    migrains  . Hypertension   . Short cervix with cervical cerclage in second trimester, antepartum 01/25/2018    Past obstetric history: OB History  Gravida Para Term Preterm AB Living  1            SAB TAB Ectopic Multiple Live Births               # Outcome Date GA Lbr Len/2nd Weight Sex Delivery Anes PTL Lv  1 Current             Past Surgical History: Past Surgical History:  Procedure Laterality Date  . CERVICAL CERCLAGE N/A 01/25/2018   Procedure: CERCLAGE CERVICAL;  Surgeon: Sherian Rein, MD;  Location: [facility];  Service: Gynecology;  Laterality: N/A;  . NO PAST SURGERIES      Family History: Family History  Problem Relation Age of Onset  . Hypertension Mother   . Hypertension Father   . Hypertension Maternal Grandmother   . Diabetes Maternal Grandmother     Social History: Social History   Tobacco Use  . Smoking status: Never Smoker  . Smokeless tobacco: Never Used  Substance Use Topics  . Alcohol use: Not Currently    Alcohol/week: 0.6 oz    Types: 1  Standard drinks or equivalent per week  . Drug use: No    Allergies: No Known Allergies  Meds:  Medications Prior to Admission  Medication Sig Dispense Refill Last Dose  . acetaminophen (TYLENOL) 500 MG tablet Take 500 mg by mouth every 6 (six) hours as needed for moderate pain or headache.   01/24/2018 at 0400  . Prenatal Vit-Fe Fumarate-FA (PRENATAL MULTIVITAMIN) TABS tablet Take 1 tablet by mouth 3 (three) times a week.    Past Week at Unknown time  . progesterone 200 MG SUPP Place 200 mg vaginally at bedtime.   Past Week at Unknown time    ROS:  Review of Systems  Constitutional: Negative.   Respiratory: Negative.   Cardiovascular: Negative.   Gastrointestinal: Positive for abdominal pain. Negative for constipation, diarrhea, nausea and vomiting.  Genitourinary: Positive for vaginal discharge. Negative for difficulty urinating, dysuria, frequency, pelvic pain, urgency and vaginal bleeding.   I have reviewed patient's Past Medical Hx, Surgical Hx, Family Hx, Social Hx, medications and allergies.   Physical Exam   Patient Vitals for the past 24 hrs:  BP Temp Temp src Pulse Resp SpO2 Height Weight  03/21/18 0430 130/75 98.4 F (36.9 C) Oral 99 16 99 % - -  03/21/18 0130 114/65 98 F (36.7 C) - (!) 125 18 100 % - -  03/21/18 0100 (!) 141/83 98 F (36.7 C) Oral (!) 107 18 100 % - -  03/21/18 0038 131/85 - - - - - - -  03/20/18 2253 (!) 139/95 - - 97 - - - -  03/20/18 2124 (!) 150/94 98.7 F (37.1 C) Oral (!) 106 18 100 % 5\' 2"  (1.575 m) 200 lb (90.7 kg)   Constitutional: Well-developed, well-nourished female in no acute distress.  Cardiovascular: normal rate Respiratory: normal effort GI: Abd soft, non-tender, gravid appropriate for gestational age.  MS: Extremities nontender, no edema, normal ROM Neurologic: Alert and oriented x 4.  GU: Neg CVAT.  PELVIC EXAM: Cervix pink, visually closed- cerclage in place, without lesion, small amount of clear watery discharge pooling  into vaginal, vaginal walls and external genitalia normal  Dilation: Closed(cerclage in place) Cervical Position: Posterior  FHT:  Baseline 140 , moderate variability, accelerations present, no decelerations Contractions: q 3-5 mins/ mild by palpation    Labs: Results for orders placed or performed during the hospital encounter of 03/20/18 (from the past 24 hour(s))  Urinalysis, Routine w reflex microscopic     Status: Abnormal   Collection Time: 03/20/18 10:02 PM  Result Value Ref Range   Color, Urine YELLOW YELLOW   APPearance CLEAR CLEAR   Specific Gravity, Urine 1.013 1.005 - 1.030   pH 6.0 5.0 - 8.0   Glucose, UA NEGATIVE NEGATIVE mg/dL   Hgb urine dipstick NEGATIVE NEGATIVE   Bilirubin Urine NEGATIVE NEGATIVE   Ketones, ur NEGATIVE NEGATIVE mg/dL   Protein, ur NEGATIVE NEGATIVE mg/dL   Nitrite NEGATIVE NEGATIVE   Leukocytes, UA SMALL (A) NEGATIVE   RBC / HPF 0-5 0 - 5 RBC/hpf   WBC, UA 6-10 0 - 5 WBC/hpf   Bacteria, UA RARE (A) NONE SEEN   Squamous Epithelial / LPF 0-5 0 - 5   Mucus PRESENT   Amnisure rupture of membrane (rom)not at [facility]     Status: None   Collection Time: 03/20/18 10:35 PM  Result Value Ref Range   Amnisure ROM POSITIVE   Wet prep, genital     Status: Abnormal   Collection Time: 03/20/18 10:38 PM  Result Value Ref Range   Yeast Wet Prep HPF POC NONE SEEN NONE SEEN   Trich, Wet Prep NONE SEEN NONE SEEN   Clue Cells Wet Prep HPF POC NONE SEEN NONE SEEN   WBC, Wet Prep HPF POC MODERATE (A) NONE SEEN   Sperm NONE SEEN   Fern Test     Status: None   Collection Time: 03/20/18 11:01 PM  Result Value Ref Range   POCT Fern Test Negative = intact amniotic membranes    Imaging:  Ultrasound shows di/di twin gestation with fetus a largest pocket of amniotic fluid being 5.76 and fetus B largest pocket of amniotic fluid being 5.08cm.  No measurable cervix is noted and cerclage is visualized on ultrasound  MAU Course/MDM: Orders Placed This Encounter   Procedures  . Wet prep, genital  . Culture, OB Urine  . [place] MFM OB Transvaginal  . Urinalysis, Routine w reflex microscopic  . Amnisure rupture of membrane (rom)not at [facility]  . Fern Test   [employer] prep negative Urine culture pending Fern negative AmniSure positive  Meds ordered this encounter  Medications  . NIFEdipine (PROCARDIA) capsule 10 mg  . lactated ringers bolus 1,000 mL   NST reviewed-reactive  Treatments in MAU included Procardia x3 doses given with LR fluid bolus.  Patient reports decreased pain with contractions.  She reports she is able to talk through contractions but is still feeling tightening and pressure.   Consult [facility] with presentation, exam findings and test results.  Recommends admission to antenatal for observation. Threatened preterm delivery with possible rupture of membranes and preterm contractions.   Assessment: 1. Short cervix with cervical cerclage in second trimester, antepartum   2. Encounter for antenatal screening for cervical length   3. Preterm premature rupture of membranes     Plan: Admit to antenatal Repeat labs and pelvic exam tomorrow Orders placed Care taken over by Dr Jackelyn KnifeMeisinger.    Steward DroneVeronica Deonta Bomberger Certified Nurse-Midwife 03/21/2018 12:20 AM

## 2018-03-22 LAB — CULTURE, OB URINE: Culture: NO GROWTH

## 2018-03-22 MED ORDER — NIFEDIPINE 10 MG PO CAPS: 10.0000 mg | ORAL_CAPSULE | Freq: Four times a day (QID) | ORAL | 1 refills | Status: DC | PRN

## 2018-03-22 MED ORDER — FAMOTIDINE 40 MG PO TABS: 40.0000 mg | ORAL_TABLET | Freq: Every day | ORAL | 6 refills | Status: DC

## 2018-03-22 NOTE — Progress Notes (Signed)
Patient ID: Katelyn Snow, fDanielle Dessemale   DOB: [birth year], 37 y.o.   MRN: [id number]   36yo G1 at 26+1 with twins - cerclage, rupture not confirmed.    +FM, no LOF, no VB, occ ctx.  Had gush of urine yesterday, fluid on US WNL, nervous about going home.  States last night was "not a good night"  AFVSS gen NAD abd soft, gravid, NT  FHTs 140-150's, mod var, category 1 toco rare  US reveals funneling to cerclage.   Will monitor through day today.

## 2018-03-22 NOTE — Discharge Summary (Signed)
Physician Discharge Summary  Patient ID: Katelyn Snow MRN: [id number] DOB/AGE: 1981-04-19 36 y.o.  Admit date: 03/20/2018 Discharge date: 04/07/2018  Admission Diagnoses: Question PPROM  Discharge Diagnoses:  Active Problems:   Threatened preterm labor, second trimester   Discharged Condition: good  Hospital Course: Admitted for questionable rupture.  R/o w US.  Also funneling cervix, cerclage present.  Desires d/c after received BMZ.  Pain decreased  Consults: None  Significant Diagnostic Studies: Ultrasound  Treatments: BMZ, monitoring  Discharge Exam: Blood pressure 124/79, pulse (!) 118, temperature 97.9 F (36.6 C), temperature source Oral, resp. rate 17, height 5\' 2"  (1.575 m), weight 90.7 kg (200 lb), last menstrual period 09/20/2017, SpO2 96 %. General appearance: alert and no distress GI: soft, gravid, NT babies stable  Disposition:    Discharge Instructions    Bed rest   Complete by:  As directed    Limit activity!!!   Diet - low sodium heart healthy   Complete by:  As directed    Discharge instructions   Complete by:  As directed    Call [phone] with questions or problems   Other Restrictions   Complete by:  As directed    Labor precautions: call with loss of fluid (like you broke your water), heavy bleeding, regular, painful contractions every 3-5 minutes; or decreased fetal movement after kick counts     Allergies as of 03/22/2018   No Known Allergies     Medication List    TAKE these medications   ferrous sulfate 325 (65 FE) MG tablet Take 325 mg by mouth daily with breakfast.   prenatal multivitamin Tabs tablet Take 1 tablet by mouth 3 (three) times a week.      Follow-up Information    Bovard-Stuckert, Karrington Mccravy, MD Follow up.   Specialty:  Obstetrics and Gynecology Why:  As scheduled 03/29/18 for ultrasound, glucola and appointment Contact information: [address] [place] [id number] [phone]            Signed: Sherian ReinJody Bovard-Stuckert 04/07/2018, 4:14 PM

## 2018-03-22 NOTE — Progress Notes (Signed)
Patient ID: Katelyn Snow, female   DOB: 1981-08-02, 37 y.o.   MRN: [id number]   Pt desires d/c to home.  AFVSS gen NAD FHTs 140's -150's mod var, category 1 FHTs 140-150's mod var, category 1 toco none  Has f/u at office.  Growth scan next week S/p BMZ Given preterm precautions, decreased activity

## 2018-03-22 NOTE — Progress Notes (Signed)
Discharge instructions discussed with patient. Reviewed medication changes and follow up appointments. Discussed when to call doctor including leaking of fluid and 3-225min contractions.  Paperwork signed and IV removed at this time.

## 2018-03-23 ENCOUNTER — Inpatient Hospital Stay (HOSPITAL_COMMUNITY)
Admission: AD | Admit: 2018-03-23 | Discharge: 2018-03-29 | Disposition: A | Discharge status: Home / Self Care | Payer: BLUE CROSS/BLUE SHIELD | Source: Ambulatory Visit | Attending: Obstetrics and Gynecology | Admitting: Obstetrics and Gynecology

## 2018-03-23 ENCOUNTER — Encounter (HOSPITAL_COMMUNITY): Payer: Self-pay | Admitting: *Deleted

## 2018-03-23 ENCOUNTER — Inpatient Hospital Stay (HOSPITAL_COMMUNITY): Payer: BLUE CROSS/BLUE SHIELD

## 2018-03-23 ENCOUNTER — Other Ambulatory Visit: Payer: Self-pay

## 2018-03-23 DIAGNOSIS — Z79899 Other long term (current) drug therapy: Secondary | ICD-10-CM

## 2018-03-23 DIAGNOSIS — O4692 Antepartum hemorrhage, unspecified, second trimester: Secondary | ICD-10-CM | POA: Insufficient documentation

## 2018-03-23 DIAGNOSIS — N281 Cyst of kidney, acquired: Secondary | ICD-10-CM | POA: Insufficient documentation

## 2018-03-23 DIAGNOSIS — O3432 Maternal care for cervical incompetence, second trimester: Secondary | ICD-10-CM | POA: Insufficient documentation

## 2018-03-23 DIAGNOSIS — Z3A27 27 weeks gestation of pregnancy: Secondary | ICD-10-CM | POA: Diagnosis not present

## 2018-03-23 DIAGNOSIS — O26612 Liver and biliary tract disorders in pregnancy, second trimester: Secondary | ICD-10-CM

## 2018-03-23 DIAGNOSIS — O162 Unspecified maternal hypertension, second trimester: Secondary | ICD-10-CM | POA: Insufficient documentation

## 2018-03-23 DIAGNOSIS — M549 Dorsalgia, unspecified: Secondary | ICD-10-CM | POA: Insufficient documentation

## 2018-03-23 DIAGNOSIS — K8020 Calculus of gallbladder without cholecystitis without obstruction: Secondary | ICD-10-CM

## 2018-03-23 DIAGNOSIS — O4702 False labor before 37 completed weeks of gestation, second trimester: Secondary | ICD-10-CM

## 2018-03-23 DIAGNOSIS — O3433 Maternal care for cervical incompetence, third trimester: Secondary | ICD-10-CM

## 2018-03-23 DIAGNOSIS — Z3A26 26 weeks gestation of pregnancy: Secondary | ICD-10-CM

## 2018-03-23 DIAGNOSIS — O26872 Cervical shortening, second trimester: Secondary | ICD-10-CM | POA: Diagnosis present

## 2018-03-23 DIAGNOSIS — O26892 Other specified pregnancy related conditions, second trimester: Secondary | ICD-10-CM | POA: Insufficient documentation

## 2018-03-23 DIAGNOSIS — O30042 Twin pregnancy, dichorionic/diamniotic, second trimester: Secondary | ICD-10-CM

## 2018-03-23 DIAGNOSIS — O99212 Obesity complicating pregnancy, second trimester: Secondary | ICD-10-CM

## 2018-03-23 DIAGNOSIS — R102 Pelvic and perineal pain: Secondary | ICD-10-CM

## 2018-03-23 DIAGNOSIS — O10919 Unspecified pre-existing hypertension complicating pregnancy, unspecified trimester: Secondary | ICD-10-CM

## 2018-03-23 DIAGNOSIS — O26832 Pregnancy related renal disease, second trimester: Secondary | ICD-10-CM | POA: Insufficient documentation

## 2018-03-23 DIAGNOSIS — Z8249 Family history of ischemic heart disease and other diseases of the circulatory system: Secondary | ICD-10-CM | POA: Insufficient documentation

## 2018-03-23 DIAGNOSIS — R109 Unspecified abdominal pain: Secondary | ICD-10-CM

## 2018-03-23 DIAGNOSIS — O09512 Supervision of elderly primigravida, second trimester: Secondary | ICD-10-CM

## 2018-03-23 DIAGNOSIS — O4690 Antepartum hemorrhage, unspecified, unspecified trimester: Secondary | ICD-10-CM

## 2018-03-23 LAB — TYPE AND SCREEN
ABO/RH(D): O POS
ANTIBODY SCREEN: NEGATIVE

## 2018-03-23 LAB — URINALYSIS, ROUTINE W REFLEX MICROSCOPIC
Bacteria, UA: NONE SEEN
Bilirubin Urine: NEGATIVE
GLUCOSE, UA: NEGATIVE mg/dL
KETONES UR: NEGATIVE mg/dL
Nitrite: NEGATIVE
PH: 7 (ref 5.0–8.0)
PROTEIN: NEGATIVE mg/dL
Specific Gravity, Urine: 1.01 (ref 1.005–1.030)

## 2018-03-23 MED ORDER — CALCIUM CARBONATE ANTACID 500 MG PO CHEW: 2.0000 | CHEWABLE_TABLET | ORAL | Status: DC | PRN

## 2018-03-23 MED ORDER — SODIUM CHLORIDE 0.9% FLUSH
3.0000 mL | INTRAVENOUS | Status: DC | PRN
  Administered 2018-03-28 (×2): 3 mL via INTRAVENOUS
  Filled 2018-03-23 (×2): qty 3

## 2018-03-23 MED ORDER — PROGESTERONE MICRONIZED 200 MG PO CAPS
200.0000 mg | ORAL_CAPSULE | Freq: Every day | ORAL | Status: DC
  Administered 2018-03-24 – 2018-03-28 (×5): 200 mg via VAGINAL
  Filled 2018-03-23 (×5): qty 1

## 2018-03-23 MED ORDER — LACTATED RINGERS IV BOLUS
1000.0000 mL | Freq: Once | INTRAVENOUS | Status: AC
  Administered 2018-03-28: 1000 mL via INTRAVENOUS

## 2018-03-23 MED ORDER — PRENATAL MULTIVITAMIN CH
1.0000 | ORAL_TABLET | Freq: Every day | ORAL | Status: DC
  Administered 2018-03-25 – 2018-03-28 (×4): 1 via ORAL
  Filled 2018-03-23 (×4): qty 1

## 2018-03-23 MED ORDER — SODIUM CHLORIDE 0.9 % IV SOLN: 250.0000 mL | INTRAVENOUS | Status: DC | PRN

## 2018-03-23 MED ORDER — BUTORPHANOL TARTRATE 1 MG/ML IJ SOLN
2.0000 mg | Freq: Once | INTRAMUSCULAR | Status: AC
  Administered 2018-03-23: 2 mg via INTRAVENOUS
  Filled 2018-03-23: qty 2

## 2018-03-23 MED ORDER — PROMETHAZINE HCL 25 MG/ML IJ SOLN
25.0000 mg | Freq: Four times a day (QID) | INTRAMUSCULAR | Status: DC | PRN
  Administered 2018-03-23 – 2018-03-24 (×2): 25 mg via INTRAVENOUS
  Filled 2018-03-23 (×2): qty 1

## 2018-03-23 MED ORDER — ZOLPIDEM TARTRATE 5 MG PO TABS
5.0000 mg | ORAL_TABLET | Freq: Every evening | ORAL | Status: DC | PRN
  Administered 2018-03-28: 5 mg via ORAL
  Filled 2018-03-23 (×2): qty 1

## 2018-03-23 MED ORDER — PROGESTERONE MICRONIZED 200 MG PO CAPS
200.0000 mg | ORAL_CAPSULE | Freq: Every day | ORAL | Status: DC
  Administered 2018-03-23: 200 mg via VAGINAL
  Filled 2018-03-23: qty 1

## 2018-03-23 MED ORDER — MAGNESIUM SULFATE 40 G IN LACTATED RINGERS - SIMPLE
2.0000 g/h | INTRAVENOUS | Status: DC
  Administered 2018-03-23 (×2): 2 g/h via INTRAVENOUS
  Filled 2018-03-23 (×2): qty 40

## 2018-03-23 MED ORDER — MAGNESIUM SULFATE BOLUS VIA INFUSION
4.0000 g | Freq: Once | INTRAVENOUS | Status: AC
  Administered 2018-03-23: 4 g via INTRAVENOUS
  Filled 2018-03-23: qty 500

## 2018-03-23 MED ORDER — LACTATED RINGERS IV SOLN
INTRAVENOUS | Status: DC
  Administered 2018-03-23 – 2018-03-24 (×3): via INTRAVENOUS

## 2018-03-23 MED ORDER — ACETAMINOPHEN 325 MG PO TABS
650.0000 mg | ORAL_TABLET | ORAL | Status: DC | PRN
  Administered 2018-03-23 – 2018-03-28 (×4): 650 mg via ORAL
  Filled 2018-03-23 (×4): qty 2

## 2018-03-23 MED ORDER — FAMOTIDINE 20 MG PO TABS
20.0000 mg | ORAL_TABLET | Freq: Two times a day (BID) | ORAL | Status: DC | PRN
  Administered 2018-03-23: 20 mg via ORAL
  Filled 2018-03-23: qty 1

## 2018-03-23 MED ORDER — SODIUM CHLORIDE 0.9% FLUSH
3.0000 mL | Freq: Two times a day (BID) | INTRAVENOUS | Status: DC
  Administered 2018-03-25 – 2018-03-27 (×6): 3 mL via INTRAVENOUS

## 2018-03-23 MED ORDER — DOCUSATE SODIUM 100 MG PO CAPS
100.0000 mg | ORAL_CAPSULE | Freq: Every day | ORAL | Status: DC
  Administered 2018-03-25 – 2018-03-26 (×2): 100 mg via ORAL
  Filled 2018-03-23 (×4): qty 1

## 2018-03-23 NOTE — H&P (Signed)
Katelyn Snow is a 37 y.o. female G1P0 at 4726+ with di/di twins, also shortened cervix with cerclage place.  D/C, yesterday s/p BMZ x 2.  Represented with VB and pain.  VB mucousy and brownish red.  On exam in MAU NP couldn't see cerclage.  Pt with intermittent pain. Pt with increased BP, AMA, obesity, SMA carrier, alpha and beta thallasemia.     OB History    Gravida  1   Para      Term      Preterm      AB      Living        SAB      TAB      Ectopic      Multiple      Live Births            G1 di/di twins - cervical shortening - cerclage placed, pregnancy from [facility] and timed IC + abn pap, +HR HPV Remote h/o Chl  Past Medical History:  Diagnosis Date  . Blood transfusion without reported diagnosis   . Headache(784.0)    migrains  . Hypertension   . Short cervix with cervical cerclage in second trimester, antepartum 01/25/2018   Past Surgical History:  Procedure Laterality Date  . CERVICAL CERCLAGE N/A 01/25/2018   Procedure: CERCLAGE CERVICAL;  Surgeon: Sherian ReinBovard-Stuckert, Leianna Barga, MD;  Location: [facility];  Service: Gynecology;  Laterality: N/A;  . NO PAST SURGERIES     Family History: family history includes Diabetes in her maternal grandmother; Hypertension in her father, maternal grandmother, and mother. Social History:  reports that she has never smoked. She has never used smokeless tobacco. She reports that she drank about 0.6 oz of alcohol per week. She reports that she does not use drugs. Meds trispriontec, orthotricyclen - sens meds PNV    Maternal Diabetes: No Genetic Screening: Normal Maternal Ultrasounds/Referrals: Normal Fetal Ultrasounds or other Referrals:  None Maternal Substance Abuse:  No Significant Maternal Medications:  None Significant Maternal Lab Results:  None Other Comments:  cerclage; di/di twins  Review of Systems  Constitutional: Negative.   HENT: Negative.   Eyes: Negative.   Respiratory: Negative.   Cardiovascular: Negative.    Gastrointestinal: Positive for abdominal pain.  Genitourinary: Negative.        Pelvic pain  Musculoskeletal: Negative.   Skin: Negative.   Neurological: Negative.   Psychiatric/Behavioral: Negative.    Maternal Medical History:  Reason for admission: Contractions and vaginal bleeding.   Contractions: Frequency: irregular.   Perceived severity is strong.    Fetal activity: Perceived fetal activity is normal.    Prenatal complications: Twins - di/di, shortened cervix w cerclage  Prenatal Complications - Diabetes: none.      Blood pressure (!) 144/94, pulse (!) 102, temperature 98.5 F (36.9 C), temperature source Oral, resp. rate 19, height 5\' 2"  (1.575 m), weight 91.6 kg (202 lb), last menstrual period 09/20/2017, SpO2 98 %. Maternal Exam:  Uterine Assessment: Contraction frequency is irregular.   Abdomen: Patient reports no abdominal tenderness. Fundal height is appropriate for gestation.    Introitus: Normal vulva. Normal vagina.    Physical Exam  Constitutional: She is oriented to person, place, and time. She appears well-developed and well-nourished.  HENT:  Head: Normocephalic and atraumatic.  Cardiovascular: Normal rate and regular rhythm.  Respiratory: Effort normal and breath sounds normal. No respiratory distress. She has no wheezes.  GI: Soft. Bowel sounds are normal. She exhibits no distension. There is no tenderness.  Musculoskeletal: Normal range of motion.  Neurological: She is alert and oriented to person, place, and time.  Skin: Skin is warm and dry.  Psychiatric: She has a normal mood and affect. Her behavior is normal.     Pelvic - nl efg, SSE - cervix - cerclage intact - no bleeding, knot at 12 o'clock; os ? FT - ? Membranes at os  Prenatal labs: ABO, Rh: --/--/O POS (07/13 1610) Antibody: NEG (07/13 0412) Rubella:  immune RPR:   NR HBsAg:   neg HIV:   neg GBS:   unknown  Assessment/Plan: 36yo G1P0 at 26+ short cervix and pain Cerclage -  add prometrium Renal US Ctx not palpated Phenergan/Stadol for pain   Marvel Sapp Bovard-Stuckert 03/23/2018, 7:26 AM

## 2018-03-23 NOTE — Plan of Care (Signed)
  Problem: Education: Goal: Knowledge of disease or condition will improve Outcome: Progressing   

## 2018-03-23 NOTE — MAU Provider Note (Addendum)
History     CSN: [id number]  Arrival date and time: 03/23/18 8295   First Provider Initiated Contact with Patient 03/23/18 0340      Chief Complaint  Patient presents with  . Back Pain   HPI Katelyn Snow is a 37 y.o. G1P0 at [redacted]w[redacted]d with twins who presents to MAU today with complaint of pelvic pain and vaginal bleeding. The patient was discharged from antenatal yesterday morning after PPROM was ruled out. Patient states that since yesterday afternoon she has had increasing pelvic pain. She has noted spotting in the toilet and her underwear tonight. She denies LOF. She reports +FM. She has a cerclage in place and has had significant funneling of the cervix to the level of the cerclage noted previously.   OB History    Gravida  1   Para      Term      Preterm      AB      Living        SAB      TAB      Ectopic      Multiple      Live Births              Past Medical History:  Diagnosis Date  . Blood transfusion without reported diagnosis   . Headache(784.0)    migrains  . Hypertension   . Short cervix with cervical cerclage in second trimester, antepartum 01/25/2018    Past Surgical History:  Procedure Laterality Date  . CERVICAL CERCLAGE N/A 01/25/2018   Procedure: CERCLAGE CERVICAL;  Surgeon: Sherian Rein, MD;  Location: [facility];  Service: Gynecology;  Laterality: N/A;  . NO PAST SURGERIES      Family History  Problem Relation Age of Onset  . Hypertension Mother   . Hypertension Father   . Hypertension Maternal Grandmother   . Diabetes Maternal Grandmother     Social History   Tobacco Use  . Smoking status: Never Smoker  . Smokeless tobacco: Never Used  Substance Use Topics  . Alcohol use: Not Currently    Alcohol/week: 0.6 oz    Types: 1 Standard drinks or equivalent per week  . Drug use: No    Allergies: No Known Allergies  Medications Prior to Admission  Medication Sig Dispense Refill Last Dose  . acetaminophen  (TYLENOL) 500 MG tablet Take 500 mg by mouth every 6 (six) hours as needed for moderate pain or headache.   03/19/2018 at Unknown time  . famotidine (PEPCID) 40 MG tablet Take 1 tablet (40 mg total) by mouth daily. 30 tablet 6   . ferrous sulfate 325 (65 FE) MG tablet Take 325 mg by mouth daily with breakfast.   03/19/2018  . NIFEdipine (PROCARDIA) 10 MG capsule Take 1 capsule (10 mg total) by mouth every 6 (six) hours as needed. 40 capsule 1   . Prenatal Vit-Fe Fumarate-FA (PRENATAL MULTIVITAMIN) TABS tablet Take 1 tablet by mouth 3 (three) times a week.    03/18/2018 at Unknown time    Review of Systems  Constitutional: Negative for fever.  Gastrointestinal: Positive for abdominal pain. Negative for constipation, diarrhea, nausea and vomiting.  Genitourinary: Positive for vaginal bleeding and vaginal discharge.   Physical Exam   Blood pressure 125/83, pulse (!) 103, temperature 98.1 F (36.7 C), resp. rate 18, height 5\' 2"  (1.575 m), weight 202 lb (91.6 kg), last menstrual period 09/20/2017.  Physical Exam  Nursing note and vitals reviewed. Constitutional: She is  oriented to person, place, and time. She appears well-developed and well-nourished. No distress.  HENT:  Head: Normocephalic and atraumatic.  Cardiovascular: Normal rate.  Respiratory: Effort normal.  GI: Soft. She exhibits no distension. There is no tenderness.  Genitourinary: Uterus is enlarged. Uterus is not tender. There is bleeding (small) in the vagina. Vaginal discharge (large mucous) found.    Neurological: She is alert and oriented to person, place, and time.  Skin: Skin is warm and dry. No erythema.  Psychiatric: She has a normal mood and affect.     Results for orders placed or performed during the hospital encounter of 03/23/18 (from the past 24 hour(s))  Urinalysis, Routine w reflex microscopic     Status: Abnormal   Collection Time: 03/23/18  3:44 AM  Result Value Ref Range   Color, Urine YELLOW YELLOW    APPearance CLEAR CLEAR   Specific Gravity, Urine 1.010 1.005 - 1.030   pH 7.0 5.0 - 8.0   Glucose, UA NEGATIVE NEGATIVE mg/dL   Hgb urine dipstick SMALL (A) NEGATIVE   Bilirubin Urine NEGATIVE NEGATIVE   Ketones, ur NEGATIVE NEGATIVE mg/dL   Protein, ur NEGATIVE NEGATIVE mg/dL   Nitrite NEGATIVE NEGATIVE   Leukocytes, UA SMALL (A) NEGATIVE   RBC / HPF 0-5 0 - 5 RBC/hpf   WBC, UA 6-10 0 - 5 WBC/hpf   Bacteria, UA NONE SEEN NONE SEEN   Squamous Epithelial / LPF 0-5 0 - 5   Mucus PRESENT    Fetal Monitoring: Baseline A: 135 bpm Variability: moderate  Accelerations: 10 x 10 Decelerations: none Baseline B: 130 bpm Variability: moderate Accelerations: 10 x 10 Decelerations: one variable noted Contractions: none  MAU Course  Procedures None  MDM UA today  Discussed patient with Dr. Ellyn HackBovard. [facility] Mag 4 g load and 2 g/hr. Routine antenatal admit orders + pad count.   Assessment and Plan  A: Twin pregnancy at 1375w2d Cervical incompetence  P:  [facility] orders as noted above  Vonzella NippleJulie Wenzel, PA-C 03/23/2018, 3:59 AM

## 2018-03-23 NOTE — Plan of Care (Signed)
  Problem: Pain Managment: Goal: General experience of comfort will improve Outcome: Progressing   Problem: Education: Goal: Knowledge of disease or condition will improve Outcome: Progressing   Problem: Pain Management: Goal: Relief or control of pain will improve Outcome: Progressing   

## 2018-03-23 NOTE — MAU Note (Signed)
Was seen Friday to rule out PRROM and determined not ruptured. Has cerclage with twins. Seeing some bloody mucous when urinates and "drops of blood".

## 2018-03-24 LAB — CULTURE, OB URINE: CULTURE: NO GROWTH

## 2018-03-24 MED ORDER — BUTORPHANOL TARTRATE 1 MG/ML IJ SOLN
2.0000 mg | Freq: Once | INTRAMUSCULAR | Status: AC
  Administered 2018-03-24: 2 mg via INTRAVENOUS
  Filled 2018-03-24: qty 2

## 2018-03-24 MED ORDER — LACTATED RINGERS IV BOLUS
1000.0000 mL | Freq: Once | INTRAVENOUS | Status: AC
  Administered 2018-03-24: 1000 mL via INTRAVENOUS

## 2018-03-24 MED ORDER — SODIUM CHLORIDE 0.9 % IV SOLN
25.0000 mg | Freq: Once | INTRAVENOUS | Status: DC
  Filled 2018-03-24: qty 1

## 2018-03-24 NOTE — Progress Notes (Addendum)
Patient ID: Katelyn DessShetarra M Snow, female   DOB: 1981-01-03, 37 y.o.   MRN: [id number]   36yo G1Po at 26+3 w di/di twins, shortened cervix w cerclage and contractions, was admitted w pain/ctx and vaginal bleeding.  Some small VB.  No LOF.  + FM.  Had been admitted with poss PPROM - but r/o with US (serially)  Uncomfortable periodically, no ctx are traced just irritability  Pt noncompliant w monitor  AFVSS gen tearful  FHTs fine through day, pt refuses monitoring. toco irritabilty, occ ctx Uterus NT, some tightening palpated, gravid  SSE - cervix appears as it did 2 nights ago, visually maybe FT, membranes seen at oss.  Seems to have length behind cerclage.  Cerclage intact  Will give Stadol/Phenergan US in AM D/w pt and husband - irregular ctx, small bleeding.  Shortened cervix.   Will monitor

## 2018-03-24 NOTE — Consult Note (Signed)
Neonatology Consult to Antenatal Patient:  I was asked by Dr. Hinton RaoBovard-Stuckert to see this patient in order to provide antenatal counseling due to short cervix, cerclage, twin gestation, and threatened preterm labor.  Ms. Katelyn Snow was admitted 7/13 and is now 26 3/[redacted] weeks GA, with vaginal bleeding and pain. She is currently not having active labor. She got BMZ 7/11-12 and  magnesium sulfate, which was discontinued this morning. She is now on progesterone. The twins are di-di, one girl and one boy. These are the couple's first babies.  I spoke with the patient, her husband, and her parents. We discussed the worst case of delivery in the next 1-2 days, including usual DR management, possible respiratory complications and need for support, IV access, feedings (mother desires breast feeding, which was encouraged), LOS, Mortality and Morbidity, and long term outcomes. She did not have any questions at this time. I offered a NICU tour to any interested family members and would be glad to come back if she has more questions later.  Thank you for asking me to see this patient.  Katelyn Souhristie C. Sakeenah Valcarcel, MD Neonatologist  The total length of face-to-face or floor/unit time for this encounter was 30 minutes. Counseling and/or coordination of care was 25 minutes of the above.

## 2018-03-24 NOTE — Progress Notes (Signed)
Patient ID: Katelyn Snow, female   DOB: [birth year], 37 y.o.   MRN: [id number]   26+3, di/di twins, cerclage placed  Some discomfort.  Pelvic pressure thinks needs to have BM - hsn't in several days.  Will give bathroom privileges.  +FM x 2, no LOF, no VB, occ ctx - 2-4 per hour.  Occ sm mucousy blood  AFVSS gen NAD Abd soft, gravid, NT FHTs A 135-145 mod var  B 135-145 mod var toco occ ctx, some irritability  D/C Magnesium S/p BMZ Add bathroom privileges NICU consult - MD called Close monitoring.

## 2018-03-24 NOTE — Progress Notes (Signed)
Pt refuses EFM at this time. She sates, "the babies are moving fine". Instructed pt to notify me when she wishes for EFM to be placed.

## 2018-03-25 ENCOUNTER — Encounter (HOSPITAL_COMMUNITY): Payer: Self-pay | Admitting: Certified Registered Nurse Anesthetist

## 2018-03-25 ENCOUNTER — Inpatient Hospital Stay (HOSPITAL_COMMUNITY): Payer: BLUE CROSS/BLUE SHIELD

## 2018-03-25 MED ORDER — LIDOCAINE HCL (PF) 1 % IJ SOLN
INTRAMUSCULAR | Status: AC
  Filled 2018-03-25: qty 5

## 2018-03-25 MED ORDER — NIFEDIPINE 10 MG PO CAPS
20.0000 mg | ORAL_CAPSULE | Freq: Four times a day (QID) | ORAL | Status: DC
  Administered 2018-03-25 – 2018-03-29 (×16): 20 mg via ORAL
  Filled 2018-03-25 (×16): qty 2

## 2018-03-25 MED ORDER — OXYCODONE-ACETAMINOPHEN 5-325 MG PO TABS
2.0000 | ORAL_TABLET | Freq: Three times a day (TID) | ORAL | Status: DC | PRN
  Administered 2018-03-26 – 2018-03-27 (×2): 2 via ORAL
  Administered 2018-03-28 (×2): 1 via ORAL
  Filled 2018-03-25 (×4): qty 2

## 2018-03-25 MED ORDER — PANTOPRAZOLE SODIUM 40 MG PO TBEC
40.0000 mg | DELAYED_RELEASE_TABLET | Freq: Every day | ORAL | Status: DC
Start: 2018-03-25 — End: 2018-03-29
  Administered 2018-03-25 – 2018-03-29 (×5): 40 mg via ORAL
  Filled 2018-03-25 (×5): qty 1

## 2018-03-25 NOTE — Progress Notes (Signed)
Trying to readjust fetal monitors, pt refusing to be monitored any longer at this time. Will try again later

## 2018-03-25 NOTE — Progress Notes (Signed)
Patient ID: Katelyn Snow, female   DOB: 1980/09/27, 37 y.o.   MRN: [id number] HD # 3, repeat admission Di/di twins, shortened cervix (none measurable on US), cerclage, PTL  Pt in somewhat better spirits this AM, reports 3-4 contractions an hour that are painful, good FM Slept some with the stadol   afeb VSS Gravid NT  For US today to reevaluate cervix and position check Will start procardia 20mg  po q 6 hours Protonix for reflux Pt concerned about pain management at home when contractions get bad.  Willing to try oral pain meds if occurs again this PM

## 2018-03-25 NOTE — Progress Notes (Signed)
Patient ID: Katelyn Snow, female   DOB: 11-13-1980, 37 y.o.   MRN: [id number]   Pt has had a fairly good day.  Still reports contractions 3-5 an hour but maybe a bit improved with the procardia.  No LOF and good FM  afeb vss FHR category 1 x 2  Pt had US late in PM and no official reading on chart.  She states was told babies vtx/oblique (feet down) and that babies both a bit over 2 lbs.   Will try oral pain meds for recurrent painful contractions if has them this PM, continue procardia F/u formal reading from MFM, pt states there was some concern about home management, but I do not have that information.

## 2018-03-26 LAB — TYPE AND SCREEN
ABO/RH(D): O POS
ANTIBODY SCREEN: NEGATIVE

## 2018-03-26 NOTE — Progress Notes (Signed)
Unable to trace baby B, Wilkie AyeKristy RROB notified to help get baby on.   Picking up maternal hrt rate.

## 2018-03-26 NOTE — Progress Notes (Signed)
Pt off monitor to get up to BR and wash up husband helping her.

## 2018-03-26 NOTE — Progress Notes (Signed)
Patient ID: Katelyn Snow, female   DOB: May 17, 1981, 37 y.o.   MRN: [id number]   IUP @ 26+5; with cerclage with ctx and pain  D/W MFM - advised keeping until 28wk - however if stable can discharge by the weekend.  By US babies are vtx/br  +FM x 2, no LOF, no VB,(sm spotting, occ), irr ctx 2-4/hr  AFVSS gen NAD FHTs A 140's, mod var, + accels, category 1  B 140's mod var, + accels, category 1  toco irregular  Pt states yesterday was a good day.  Desires d/c home before weekend.  Will monitor closely Poss d/c wed-Fri.

## 2018-03-26 NOTE — Progress Notes (Deleted)
Unable to get baby B on monitor, called Kristy RROB to come up and help.up maternal heart rate

## 2018-03-27 NOTE — Progress Notes (Signed)
Patient ID: Katelyn Snow, female   DOB: 20-May-1981, 37 y.o.   MRN: [id number]  HD#5 IUP with di-di twins vertex/breech at  26+6; with cerclage; preterm contractions  Pt reports contractions have spaced out and are less painful at this time. Reports +Fms. No complaints  EFM will be done between 10-11am today;  140s x 2 overnight   TOCO - irritability with rare contraction noted  SVE - deferred  Plan: EFM q shift          On tocolytics          Possible discharge to home on Friday if stable otherwise keep till 28weeks

## 2018-03-27 NOTE — Progress Notes (Signed)
Dr. Ellyn HackBovard  On phone updated with on pt. @11  pm pt started ctx and breathing through given procardia and at midnight given 2 percocets.  Ctx spaced out and pt fell asleep.   Pt slept most of night.

## 2018-03-28 MED ORDER — LACTATED RINGERS IV BOLUS: 1000.0000 mL | Freq: Once | INTRAVENOUS | Status: DC

## 2018-03-28 MED ORDER — HYDROCODONE-ACETAMINOPHEN 5-325 MG PO TABS
2.0000 | ORAL_TABLET | Freq: Every day | ORAL | Status: DC
  Administered 2018-03-28: 2 via ORAL
  Filled 2018-03-28: qty 2

## 2018-03-28 NOTE — Progress Notes (Signed)
Patient ID: Katelyn Snow, female   DOB: 1981/07/11, 37 y.o.   MRN: [id number] Called by pts nurse and informed pt complaining of contractions and vaginal bleeding.  Pt states had a BM and then later noted vaginal bleeding and intermittent contractions - received procardia at midnight and also a percocet an hour ago. Came to assess pt within minutes of complaint; no contractions while I was in room Sterile spec exam shows intact cerclage, closed cervix and small amount of light bleeding in vault; prior pads also examined Plan: 1L LR hydration now          Continue with previous care plan

## 2018-03-28 NOTE — Progress Notes (Signed)
Patient ID: Danielle DessShetarra M Hellmann, female   DOB: [birth year], 37 y.o.   MRN: [id number]  27wk twins, short cervix and cerclage  Some ctx O/N, +FM, no LOF, sm VB.  Trying to make a plan for overnight - that's when he pain is bad.  Will change from percocet to Norco  AFVSS gen NAD Abd soft, gravid, NT  FHTs 140's mod var, category 1  150's mod var, category 1 toco irr, some irritability  Cont current mgmt S/p BMZ Change to [facility] Plans for d/c tomorrow - d/w pt coping at home

## 2018-03-29 MED ORDER — ZOLPIDEM TARTRATE 5 MG PO TABS: 5.0000 mg | ORAL_TABLET | Freq: Every evening | ORAL | 0 refills | Status: DC | PRN

## 2018-03-29 MED ORDER — HYDROCODONE-ACETAMINOPHEN 5-325 MG PO TABS: 2.0000 | ORAL_TABLET | Freq: Every day | ORAL | 0 refills | Status: DC

## 2018-03-29 MED ORDER — PROGESTERONE MICRONIZED 200 MG PO CAPS: 200.0000 mg | ORAL_CAPSULE | Freq: Every day | ORAL | 1 refills | Status: DC

## 2018-03-29 MED ORDER — NIFEDIPINE 20 MG PO CAPS: 20.0000 mg | ORAL_CAPSULE | Freq: Four times a day (QID) | ORAL | 1 refills | Status: DC

## 2018-03-29 MED ORDER — ACETAMINOPHEN 325 MG PO TABS: 650.0000 mg | ORAL_TABLET | ORAL | 0 refills | Status: DC | PRN

## 2018-03-29 MED ORDER — PANTOPRAZOLE SODIUM 40 MG PO TBEC: 40.0000 mg | DELAYED_RELEASE_TABLET | Freq: Every day | ORAL | 1 refills | Status: DC

## 2018-03-29 NOTE — Discharge Summary (Signed)
Physician Discharge Summary  Patient ID: Katelyn Snow MRN: [id number] DOB/AGE: 12-29-1980 37 y.o.  Admit date: 03/23/2018 Discharge date: 03/29/2018  Admission Diagnoses: Short cervical length Di/Di twin gestation Cerclage in place Preterm contractions  Discharge Diagnoses:  Active Problems:   Short cervix with cervical cerclage in second trimester, antepartum Di/Di twin gestation Cerclage in place Preterm contractions   Discharged Condition: good  Hospital Course: Pt was admitted with intermittent painful contractions and cerclage/shortened cervix.  Had a prior admission and returned to the hospital same day of discharge.  Received betamethasone x 2 (03/21/18 and 03/22/18).  Troughout her admission her cerclage remained intact and Baby A vtx/Baby B breech on last [place] (03/23/18), concordant.   Pt was kept in-house x 1 week to ensure stability.  She was d/c home on regimen of procardia 20 mg po q 6 hours, progestrone vaginally q hs, and norco with ambien prn contractions.  She requested home management and has family staying with her off and on to help.  They live 10 minutes from hospital.    Consults: MFM  Significant Diagnostic Studies: labs and [place]  Treatments: Procardia, betamethasone Discharge Exam: Blood pressure 123/77, pulse 94, temperature 98.4 F (36.9 C), temperature source Oral, resp. rate 16, height 5\' 2"  (1.575 m), weight 91.6 kg (202 lb), last menstrual period 09/20/2017, SpO2 94 %. General appearance: alert and cooperative GI: soft gravid NT  Disposition: Discharge disposition: 01-Home or Self Care       Discharge Instructions    Diet - low sodium heart healthy   Complete by:  As directed    Discharge instructions   Complete by:  As directed    Limit activity and rest as much as possible.  Limit showers to 15 minutes.  Stay off feet as much as possible.  Continue the procardia 20mg  po every q hours and progesterone in vagina daily.  Use norco and ambien as  needed for sleep   Sexual acrtivity   Complete by:  As directed    Nothing in vagina.  No sex or activity that causes orgasm.     Allergies as of 03/29/2018   No Known Allergies     Medication List    STOP taking these medications   famotidine 40 MG tablet Commonly known as:  PEPCID     TAKE these medications   acetaminophen 325 MG tablet Commonly known as:  TYLENOL Take 2 tablets (650 mg total) by mouth every 4 (four) hours as needed (for pain scale < 4  OR  temperature  >/=  100.5 F). What changed:    medication strength  how much to take  when to take this  reasons to take this   ferrous sulfate 325 (65 FE) MG tablet Take 325 mg by mouth daily with breakfast.   HYDROcodone-acetaminophen 5-325 MG tablet Commonly known as:  NORCO/VICODIN Take 2 tablets by mouth at bedtime.   NIFEdipine 20 MG capsule Commonly known as:  PROCARDIA Take 1 capsule (20 mg total) by mouth every 6 (six) hours. What changed:    medication strength  how much to take  when to take this  reasons to take this   pantoprazole 40 MG tablet Commonly known as:  PROTONIX Take 1 tablet (40 mg total) by mouth daily.   prenatal multivitamin Tabs tablet Take 1 tablet by mouth 3 (three) times a week.   progesterone 200 MG capsule Commonly known as:  PROMETRIUM Place 1 capsule (200 mg total) vaginally daily at 10  pm.   zolpidem 5 MG tablet Commonly known as:  AMBIEN Take 1 tablet (5 mg total) by mouth at bedtime as needed for sleep.      Follow-up Information    Katelyn AreolaBanga, Cecilia Worema, DO Follow up in 1 week(s).   Specialty:  Obstetrics and Gynecology Why:  Office appointment in one week, OB coordinator will call to set up Contact information: [address] [place] [address] [phone]           Signed: Oliver PilaKathy W Keona Snow 03/29/2018, 8:48 AM

## 2018-03-29 NOTE — Progress Notes (Signed)
Patient ID: Katelyn Snow, female   DOB: 09-18-1980, 37 y.o.   MRN: [id number] 27 1/7 weeks short cervix, cerclage  Pt had a good night.  Comfortable with the Norco and did not need ambien.  No more than 3-4 contractions/hour  Good FM x 2.  NO VB or LOF.  Pt wants to go home and has family lined up to help her.  Lives 10 minutes from hospital. Feels comfortable that she can manage contraction discomfort with norco prn  Abdomen gravid, NT  S/p BMZ 03/21/18 and 03/22/18 Stable on decreased activity, norco q PM for contractions and Procardia 20mg  po q 6 hours Ambien prn Will return to office in one week

## 2018-03-29 NOTE — Progress Notes (Signed)
Pt discharged home with printed instructions. Pt verbalized an understanding. No concerns noted. Murray Durrell L Julius Boniface, RN 

## 2018-03-30 ENCOUNTER — Inpatient Hospital Stay (HOSPITAL_COMMUNITY): Payer: BLUE CROSS/BLUE SHIELD | Admitting: Anesthesiology

## 2018-03-30 ENCOUNTER — Other Ambulatory Visit: Payer: Self-pay

## 2018-03-30 ENCOUNTER — Encounter (HOSPITAL_COMMUNITY)
Admission: AD | Disposition: A | Discharge status: Home / Self Care | Payer: Self-pay | Source: Home / Self Care | Attending: Obstetrics and Gynecology

## 2018-03-30 ENCOUNTER — Inpatient Hospital Stay (HOSPITAL_COMMUNITY)
Admission: AD | Admit: 2018-03-30 | Discharge: 2018-04-03 | DRG: 786 | Disposition: A | Discharge status: Home / Self Care | Payer: BLUE CROSS/BLUE SHIELD | Attending: Obstetrics and Gynecology | Admitting: Obstetrics and Gynecology

## 2018-03-30 ENCOUNTER — Encounter (HOSPITAL_COMMUNITY): Payer: Self-pay | Admitting: *Deleted

## 2018-03-30 DIAGNOSIS — Z3A27 27 weeks gestation of pregnancy: Secondary | ICD-10-CM | POA: Diagnosis not present

## 2018-03-30 DIAGNOSIS — Z98891 History of uterine scar from previous surgery: Secondary | ICD-10-CM

## 2018-03-30 DIAGNOSIS — E669 Obesity, unspecified: Secondary | ICD-10-CM | POA: Diagnosis present

## 2018-03-30 DIAGNOSIS — O30042 Twin pregnancy, dichorionic/diamniotic, second trimester: Secondary | ICD-10-CM | POA: Diagnosis present

## 2018-03-30 DIAGNOSIS — O3432 Maternal care for cervical incompetence, second trimester: Secondary | ICD-10-CM | POA: Diagnosis present

## 2018-03-30 DIAGNOSIS — O9902 Anemia complicating childbirth: Secondary | ICD-10-CM | POA: Diagnosis present

## 2018-03-30 DIAGNOSIS — O1002 Pre-existing essential hypertension complicating childbirth: Secondary | ICD-10-CM | POA: Diagnosis present

## 2018-03-30 DIAGNOSIS — O26872 Cervical shortening, second trimester: Secondary | ICD-10-CM | POA: Diagnosis present

## 2018-03-30 DIAGNOSIS — O99214 Obesity complicating childbirth: Secondary | ICD-10-CM | POA: Diagnosis present

## 2018-03-30 DIAGNOSIS — O328XX2 Maternal care for other malpresentation of fetus, fetus 2: Secondary | ICD-10-CM | POA: Diagnosis present

## 2018-03-30 DIAGNOSIS — D649 Anemia, unspecified: Secondary | ICD-10-CM | POA: Diagnosis present

## 2018-03-30 DIAGNOSIS — O10919 Unspecified pre-existing hypertension complicating pregnancy, unspecified trimester: Secondary | ICD-10-CM

## 2018-03-30 LAB — COMPREHENSIVE METABOLIC PANEL
ALK PHOS: 95 U/L (ref 38–126)
ALT: 11 U/L (ref 0–44)
ANION GAP: 11 (ref 5–15)
AST: 18 U/L (ref 15–41)
Albumin: 2.9 g/dL — ABNORMAL LOW (ref 3.5–5.0)
BUN: 5 mg/dL — ABNORMAL LOW (ref 6–20)
CALCIUM: 9 mg/dL (ref 8.9–10.3)
CHLORIDE: 103 mmol/L (ref 98–111)
CO2: 20 mmol/L — AB (ref 22–32)
Creatinine, Ser: 0.72 mg/dL (ref 0.44–1.00)
GFR calc Af Amer: >60 mL/min (ref >60)
Glucose, Bld: 111 mg/dL — ABNORMAL HIGH (ref 70–99)
Potassium: 3.6 mmol/L (ref 3.5–5.1)
SODIUM: 134 mmol/L — AB (ref 135–145)
Total Bilirubin: 0.6 mg/dL (ref 0.3–1.2)
Total Protein: 6.8 g/dL (ref 6.5–8.1)

## 2018-03-30 LAB — TYPE AND SCREEN
ABO/RH(D): O POS
Antibody Screen: NEGATIVE

## 2018-03-30 LAB — CBC
HCT: 30.9 % — ABNORMAL LOW (ref 36.0–46.0)
Hemoglobin: 10.2 g/dL — ABNORMAL LOW (ref 12.0–15.0)
MCH: 22.7 pg — AB (ref 26.0–34.0)
MCHC: 33 g/dL (ref 30.0–36.0)
MCV: 68.7 fL — AB (ref 78.0–100.0)
PLATELETS: 394 10*3/uL (ref 150–400)
RBC: 4.5 MIL/uL (ref 3.87–5.11)
RDW: 19.2 % — ABNORMAL HIGH (ref 11.5–15.5)
WBC: 14.9 10*3/uL — ABNORMAL HIGH (ref 4.0–10.5)

## 2018-03-30 LAB — PROTEIN / CREATININE RATIO, URINE
CREATININE, URINE: 107 mg/dL
PROTEIN CREATININE RATIO: 0.22 mg/mg{creat} — AB (ref 0.00–0.15)
Total Protein, Urine: 24 mg/dL

## 2018-03-30 SURGERY — Surgical Case
Anesthesia: Spinal | Site: Abdomen | Wound class: Clean Contaminated

## 2018-03-30 MED ORDER — SODIUM CHLORIDE 0.9% FLUSH: 3.0000 mL | INTRAVENOUS | Status: DC | PRN

## 2018-03-30 MED ORDER — MAGNESIUM SULFATE BOLUS VIA INFUSION
4.0000 g | Freq: Once | INTRAVENOUS | Status: AC
  Administered 2018-03-30: 4 g via INTRAVENOUS
  Filled 2018-03-30: qty 500

## 2018-03-30 MED ORDER — SENNOSIDES-DOCUSATE SODIUM 8.6-50 MG PO TABS
2.0000 | ORAL_TABLET | ORAL | Status: DC
  Administered 2018-03-31 – 2018-04-02 (×2): 2 via ORAL
  Filled 2018-03-30 (×3): qty 2

## 2018-03-30 MED ORDER — PHENYLEPHRINE 40 MCG/ML (10ML) SYRINGE FOR IV PUSH (FOR BLOOD PRESSURE SUPPORT)
PREFILLED_SYRINGE | INTRAVENOUS | Status: AC
Start: 2018-03-30 — End: ?
  Filled 2018-03-30: qty 10

## 2018-03-30 MED ORDER — NALBUPHINE HCL 10 MG/ML IJ SOLN: 5.0000 mg | INTRAMUSCULAR | Status: DC | PRN

## 2018-03-30 MED ORDER — NALBUPHINE HCL 10 MG/ML IJ SOLN: 5.0000 mg | Freq: Once | INTRAMUSCULAR | Status: DC | PRN

## 2018-03-30 MED ORDER — SIMETHICONE 80 MG PO CHEW
80.0000 mg | CHEWABLE_TABLET | Freq: Three times a day (TID) | ORAL | Status: DC
  Administered 2018-03-31 – 2018-04-03 (×8): 80 mg via ORAL
  Filled 2018-03-30 (×9): qty 1

## 2018-03-30 MED ORDER — DIPHENHYDRAMINE HCL 25 MG PO CAPS: 25.0000 mg | ORAL_CAPSULE | ORAL | Status: DC | PRN

## 2018-03-30 MED ORDER — NALOXONE HCL 4 MG/10ML IJ SOLN
1.0000 ug/kg/h | INTRAVENOUS | Status: DC | PRN
  Filled 2018-03-30: qty 5

## 2018-03-30 MED ORDER — OXYCODONE HCL 5 MG PO TABS: 10.0000 mg | ORAL_TABLET | ORAL | Status: DC | PRN

## 2018-03-30 MED ORDER — MORPHINE SULFATE (PF) 0.5 MG/ML IJ SOLN
INTRAMUSCULAR | Status: DC | PRN
  Administered 2018-03-30: .2 mg via EPIDURAL

## 2018-03-30 MED ORDER — SCOPOLAMINE 1 MG/3DAYS TD PT72
MEDICATED_PATCH | TRANSDERMAL | Status: AC
  Filled 2018-03-30: qty 3

## 2018-03-30 MED ORDER — ACETAMINOPHEN 325 MG PO TABS: 650.0000 mg | ORAL_TABLET | ORAL | Status: DC | PRN

## 2018-03-30 MED ORDER — OXYTOCIN 40 UNITS IN LACTATED RINGERS INFUSION - SIMPLE MED: 2.5000 [IU]/h | INTRAVENOUS | Status: AC

## 2018-03-30 MED ORDER — HYDRALAZINE HCL 20 MG/ML IJ SOLN: 10.0000 mg | Freq: Once | INTRAMUSCULAR | Status: DC | PRN

## 2018-03-30 MED ORDER — DOCUSATE SODIUM 100 MG PO CAPS
100.0000 mg | ORAL_CAPSULE | Freq: Every day | ORAL | Status: DC
  Filled 2018-03-30: qty 1

## 2018-03-30 MED ORDER — DIPHENHYDRAMINE HCL 25 MG PO CAPS: 25.0000 mg | ORAL_CAPSULE | Freq: Four times a day (QID) | ORAL | Status: DC | PRN

## 2018-03-30 MED ORDER — BUPIVACAINE IN DEXTROSE 0.75-8.25 % IT SOLN
INTRATHECAL | Status: DC | PRN
  Administered 2018-03-30: 1.4 mL via INTRATHECAL

## 2018-03-30 MED ORDER — LACTATED RINGERS IV SOLN
INTRAVENOUS | Status: DC
  Administered 2018-03-30 (×4): via INTRAVENOUS

## 2018-03-30 MED ORDER — PHENYLEPHRINE 8 MG IN D5W 100 ML (0.08MG/ML) PREMIX OPTIME
INJECTION | INTRAVENOUS | Status: DC | PRN
  Administered 2018-03-30: 60 ug/min via INTRAVENOUS

## 2018-03-30 MED ORDER — OXYTOCIN 10 UNIT/ML IJ SOLN
INTRAMUSCULAR | Status: DC | PRN
  Administered 2018-03-30: 40 [IU] via INTRAVENOUS

## 2018-03-30 MED ORDER — CEFAZOLIN SODIUM-DEXTROSE 2-4 GM/100ML-% IV SOLN
2.0000 g | Freq: Once | INTRAVENOUS | Status: AC
  Administered 2018-03-30: 2 g via INTRAVENOUS

## 2018-03-30 MED ORDER — SODIUM CHLORIDE 0.9 % IR SOLN
Status: DC | PRN
  Administered 2018-03-30: 1

## 2018-03-30 MED ORDER — ONDANSETRON HCL 4 MG/2ML IJ SOLN: 4.0000 mg | Freq: Three times a day (TID) | INTRAMUSCULAR | Status: DC | PRN

## 2018-03-30 MED ORDER — FENTANYL CITRATE (PF) 100 MCG/2ML IJ SOLN: 25.0000 ug | INTRAMUSCULAR | Status: DC | PRN

## 2018-03-30 MED ORDER — NIFEDIPINE 10 MG PO CAPS
20.0000 mg | ORAL_CAPSULE | Freq: Once | ORAL | Status: AC
  Administered 2018-03-30: 20 mg via ORAL
  Filled 2018-03-30: qty 2

## 2018-03-30 MED ORDER — SOD CITRATE-CITRIC ACID 500-334 MG/5ML PO SOLN
ORAL | Status: AC
  Administered 2018-03-30: 30 mL
  Filled 2018-03-30: qty 15

## 2018-03-30 MED ORDER — DEXAMETHASONE SODIUM PHOSPHATE 10 MG/ML IJ SOLN
INTRAMUSCULAR | Status: AC
  Filled 2018-03-30: qty 1

## 2018-03-30 MED ORDER — DIBUCAINE 1 % RE OINT: 1.0000 "application " | TOPICAL_OINTMENT | RECTAL | Status: DC | PRN

## 2018-03-30 MED ORDER — ZOLPIDEM TARTRATE 5 MG PO TABS: 5.0000 mg | ORAL_TABLET | Freq: Every evening | ORAL | Status: DC | PRN

## 2018-03-30 MED ORDER — PHENYLEPHRINE 8 MG IN D5W 100 ML (0.08MG/ML) PREMIX OPTIME
INJECTION | INTRAVENOUS | Status: AC
  Filled 2018-03-30: qty 100

## 2018-03-30 MED ORDER — SIMETHICONE 80 MG PO CHEW: 80.0000 mg | CHEWABLE_TABLET | ORAL | Status: DC | PRN

## 2018-03-30 MED ORDER — SCOPOLAMINE 1 MG/3DAYS TD PT72
MEDICATED_PATCH | TRANSDERMAL | Status: AC
  Filled 2018-03-30: qty 1

## 2018-03-30 MED ORDER — IBUPROFEN 600 MG PO TABS
600.0000 mg | ORAL_TABLET | Freq: Four times a day (QID) | ORAL | Status: DC
  Administered 2018-03-30 – 2018-04-03 (×12): 600 mg via ORAL
  Filled 2018-03-30 (×15): qty 1

## 2018-03-30 MED ORDER — SIMETHICONE 80 MG PO CHEW
80.0000 mg | CHEWABLE_TABLET | ORAL | Status: DC
  Administered 2018-03-30 – 2018-04-02 (×3): 80 mg via ORAL
  Filled 2018-03-30 (×4): qty 1

## 2018-03-30 MED ORDER — FENTANYL CITRATE (PF) 100 MCG/2ML IJ SOLN
INTRAMUSCULAR | Status: AC
Start: 2018-03-30 — End: ?
  Filled 2018-03-30: qty 2

## 2018-03-30 MED ORDER — NALOXONE HCL 0.4 MG/ML IJ SOLN: 0.4000 mg | INTRAMUSCULAR | Status: DC | PRN

## 2018-03-30 MED ORDER — PRENATAL MULTIVITAMIN CH
1.0000 | ORAL_TABLET | Freq: Every day | ORAL | Status: DC
  Administered 2018-03-31 – 2018-04-01 (×2): 1 via ORAL
  Filled 2018-03-30 (×3): qty 1

## 2018-03-30 MED ORDER — PANTOPRAZOLE SODIUM 40 MG PO TBEC
40.0000 mg | DELAYED_RELEASE_TABLET | Freq: Every day | ORAL | Status: DC
  Administered 2018-03-31 – 2018-04-01 (×2): 40 mg via ORAL
  Filled 2018-03-30 (×4): qty 1

## 2018-03-30 MED ORDER — ONDANSETRON HCL 4 MG/2ML IJ SOLN
INTRAMUSCULAR | Status: DC | PRN
  Administered 2018-03-30: 4 mg via INTRAVENOUS

## 2018-03-30 MED ORDER — CALCIUM CARBONATE ANTACID 500 MG PO CHEW: 2.0000 | CHEWABLE_TABLET | ORAL | Status: DC | PRN

## 2018-03-30 MED ORDER — BUTORPHANOL TARTRATE 1 MG/ML IJ SOLN
1.0000 mg | Freq: Once | INTRAMUSCULAR | Status: AC
  Administered 2018-03-30: 1 mg via INTRAVENOUS
  Filled 2018-03-30: qty 1

## 2018-03-30 MED ORDER — TETANUS-DIPHTH-ACELL PERTUSSIS 5-2.5-18.5 LF-MCG/0.5 IM SUSP: 0.5000 mL | Freq: Once | INTRAMUSCULAR | Status: DC

## 2018-03-30 MED ORDER — PRENATAL MULTIVITAMIN CH
1.0000 | ORAL_TABLET | Freq: Every day | ORAL | Status: DC
  Filled 2018-03-30: qty 1

## 2018-03-30 MED ORDER — OXYCODONE HCL 5 MG PO TABS
5.0000 mg | ORAL_TABLET | ORAL | Status: DC | PRN
  Administered 2018-03-30 – 2018-03-31 (×3): 5 mg via ORAL
  Filled 2018-03-30 (×3): qty 1

## 2018-03-30 MED ORDER — MORPHINE SULFATE (PF) 0.5 MG/ML IJ SOLN
INTRAMUSCULAR | Status: AC
  Filled 2018-03-30: qty 10

## 2018-03-30 MED ORDER — OXYTOCIN 10 UNIT/ML IJ SOLN
INTRAMUSCULAR | Status: AC
  Filled 2018-03-30: qty 4

## 2018-03-30 MED ORDER — MAGNESIUM SULFATE 40 G IN LACTATED RINGERS - SIMPLE
2.0000 g/h | INTRAVENOUS | Status: DC
  Filled 2018-03-30: qty 500

## 2018-03-30 MED ORDER — MEPERIDINE HCL 25 MG/ML IJ SOLN
INTRAMUSCULAR | Status: AC
  Filled 2018-03-30: qty 1

## 2018-03-30 MED ORDER — LACTATED RINGERS IV SOLN
INTRAVENOUS | Status: DC
  Administered 2018-03-30: 05:00:00 via INTRAVENOUS

## 2018-03-30 MED ORDER — DIPHENHYDRAMINE HCL 50 MG/ML IJ SOLN: 12.5000 mg | INTRAMUSCULAR | Status: DC | PRN

## 2018-03-30 MED ORDER — WITCH HAZEL-GLYCERIN EX PADS: 1.0000 "application " | MEDICATED_PAD | CUTANEOUS | Status: DC | PRN

## 2018-03-30 MED ORDER — SCOPOLAMINE 1 MG/3DAYS TD PT72
MEDICATED_PATCH | TRANSDERMAL | Status: DC | PRN
  Administered 2018-03-30: 1 via TRANSDERMAL

## 2018-03-30 MED ORDER — BUTORPHANOL TARTRATE 1 MG/ML IJ SOLN
INTRAMUSCULAR | Status: AC
  Administered 2018-03-30: 1 mg
  Filled 2018-03-30: qty 1

## 2018-03-30 MED ORDER — LACTATED RINGERS IV SOLN
INTRAVENOUS | Status: DC | PRN
  Administered 2018-03-30: 08:00:00 via INTRAVENOUS

## 2018-03-30 MED ORDER — MEPERIDINE HCL 25 MG/ML IJ SOLN
6.2500 mg | INTRAMUSCULAR | Status: DC | PRN
  Administered 2018-03-30: 6.25 mg via INTRAVENOUS

## 2018-03-30 MED ORDER — LACTATED RINGERS IV SOLN: INTRAVENOUS | Status: DC

## 2018-03-30 MED ORDER — LABETALOL HCL 5 MG/ML IV SOLN: 20.0000 mg | INTRAVENOUS | Status: DC | PRN

## 2018-03-30 MED ORDER — DEXAMETHASONE SODIUM PHOSPHATE 10 MG/ML IJ SOLN
INTRAMUSCULAR | Status: DC | PRN
  Administered 2018-03-30: 10 mg via INTRAVENOUS

## 2018-03-30 MED ORDER — PROMETHAZINE HCL 25 MG/ML IJ SOLN
12.5000 mg | Freq: Once | INTRAMUSCULAR | Status: AC
  Administered 2018-03-30: 12.5 mg via INTRAVENOUS
  Filled 2018-03-30: qty 1

## 2018-03-30 MED ORDER — FENTANYL CITRATE (PF) 100 MCG/2ML IJ SOLN
INTRAMUSCULAR | Status: DC | PRN
  Administered 2018-03-30: 20 ug via INTRATHECAL

## 2018-03-30 MED ORDER — MENTHOL 3 MG MT LOZG: 1.0000 | LOZENGE | OROMUCOSAL | Status: DC | PRN

## 2018-03-30 MED ORDER — ONDANSETRON HCL 4 MG/2ML IJ SOLN
INTRAMUSCULAR | Status: AC
  Filled 2018-03-30: qty 2

## 2018-03-30 MED ORDER — COCONUT OIL OIL
1.0000 "application " | TOPICAL_OIL | Status: DC | PRN
  Administered 2018-03-30: 1 via TOPICAL
  Filled 2018-03-30: qty 120

## 2018-03-30 MED ORDER — ZOLPIDEM TARTRATE 5 MG PO TABS
5.0000 mg | ORAL_TABLET | Freq: Every evening | ORAL | Status: DC | PRN
  Administered 2018-04-02: 5 mg via ORAL
  Filled 2018-03-30: qty 1

## 2018-03-30 MED ORDER — PHENYLEPHRINE HCL 10 MG/ML IJ SOLN
INTRAMUSCULAR | Status: DC | PRN
  Administered 2018-03-30 (×4): 40 ug via INTRAVENOUS
  Administered 2018-03-30: 80 ug via INTRAVENOUS

## 2018-03-30 SURGICAL SUPPLY — 34 items
BENZOIN TINCTURE PRP APPL 2/3 (GAUZE/BANDAGES/DRESSINGS) ×2 IMPLANT
CHLORAPREP W/TINT 26ML (MISCELLANEOUS) ×2 IMPLANT
CLAMP CORD UMBIL (MISCELLANEOUS) IMPLANT
CLOSURE STERI STRIP 1/2 X4 (GAUZE/BANDAGES/DRESSINGS) ×2 IMPLANT
CLOTH BEACON ORANGE TIMEOUT ST (SAFETY) ×2 IMPLANT
DRSG OPSITE POSTOP 4X10 (GAUZE/BANDAGES/DRESSINGS) ×2 IMPLANT
ELECT REM PT RETURN 9FT ADLT (ELECTROSURGICAL) ×2
ELECTRODE REM PT RTRN 9FT ADLT (ELECTROSURGICAL) ×1 IMPLANT
EXTRACTOR VACUUM KIWI (MISCELLANEOUS) IMPLANT
GLOVE BIO SURGEON STRL SZ 6.5 (GLOVE) ×2 IMPLANT
GLOVE BIOGEL PI IND STRL 7.0 (GLOVE) ×1 IMPLANT
GLOVE BIOGEL PI INDICATOR 7.0 (GLOVE) ×1
GOWN STRL REUS W/TWL LRG LVL3 (GOWN DISPOSABLE) ×4 IMPLANT
KIT ABG SYR 3ML LUER SLIP (SYRINGE) ×4 IMPLANT
NEEDLE HYPO 25X5/8 SAFETYGLIDE (NEEDLE) ×4 IMPLANT
NS IRRIG 1000ML POUR BTL (IV SOLUTION) ×2 IMPLANT
PACK C SECTION WH (CUSTOM PROCEDURE TRAY) ×2 IMPLANT
PAD OB MATERNITY 4.3X12.25 (PERSONAL CARE ITEMS) ×2 IMPLANT
PENCIL SMOKE EVAC W/HOLSTER (ELECTROSURGICAL) ×2 IMPLANT
RTRCTR C-SECT PINK 25CM LRG (MISCELLANEOUS) ×2 IMPLANT
STRIP CLOSURE SKIN 1/2X4 (GAUZE/BANDAGES/DRESSINGS) IMPLANT
SUT CHROMIC 1 CTX 36 (SUTURE) ×4 IMPLANT
SUT PLAIN 0 NONE (SUTURE) IMPLANT
SUT PLAIN 2 0 XLH (SUTURE) ×4 IMPLANT
SUT VIC AB 0 CT1 27 (SUTURE) ×2
SUT VIC AB 0 CT1 27XBRD ANBCTR (SUTURE) ×2 IMPLANT
SUT VIC AB 2-0 CT1 (SUTURE) ×2 IMPLANT
SUT VIC AB 2-0 CT1 27 (SUTURE) ×1
SUT VIC AB 2-0 CT1 TAPERPNT 27 (SUTURE) ×1 IMPLANT
SUT VIC AB 3-0 CT1 27 (SUTURE)
SUT VIC AB 3-0 CT1 TAPERPNT 27 (SUTURE) IMPLANT
SUT VIC AB 4-0 KS 27 (SUTURE) ×2 IMPLANT
TOWEL OR 17X24 6PK STRL BLUE (TOWEL DISPOSABLE) ×2 IMPLANT
TRAY FOLEY W/BAG SLVR 14FR LF (SET/KITS/TRAYS/PACK) ×2 IMPLANT

## 2018-03-30 NOTE — Anesthesia Postprocedure Evaluation (Signed)
Anesthesia Post Note  Patient: Danielle DessShetarra M Marmolejos  Procedure(s) Performed: CESAREAN SECTION (N/A Abdomen)     Patient location during evaluation: PACU Anesthesia Type: Spinal Level of consciousness: oriented and awake and alert Pain management: pain level controlled Vital Signs Assessment: post-procedure vital signs reviewed and stable Respiratory status: spontaneous breathing and respiratory function stable Cardiovascular status: blood pressure returned to baseline and stable Postop Assessment: no headache, no backache and no apparent nausea or vomiting Anesthetic complications: no    Last Vitals:  Vitals:   03/30/18 1000 03/30/18 1015  BP: (!) 143/97 (!) 145/101  Pulse: (!) 109 (!) 116  Resp: 18 19  Temp: 36.6 C   SpO2: 99% 100%    Last Pain:  Vitals:   03/30/18 1000  TempSrc: Oral  PainSc: 7    Pain Goal: Patients Stated Pain Goal: 0 (03/30/18 0119)               Lowella CurbWarren Ray Irean Kendricks

## 2018-03-30 NOTE — Lactation Note (Signed)
This note was copied from a baby's chart. Lactation Consultation Note  Patient Name: Katelyn Snow: 03/30/2018 Reason for consult: Initial assessment;Other (Comment);NICU baby;Multiple gestation;Preterm <34wks(referrral from RN)  Initiated pumping with mom.  G1P2. Mom reports her baby shower is today and dad has gone.  Mom reports csection/breech multiples/extreme prematurity/NICU. Mom reports did not get breast pump.  Inquired about best breastpump to use.  Urged mom to contact her insurance provider.  Discussed possibility of multi user/hospital grade breastpump due to multiples in NICU. Mom plans to contact [employer] Monday to see if they will help with breastpump.  Urged pumping 8-12 times day.  Reviewed and gave NICU booklet with mom.  Gave and reviewed BFSG and breastfeeding resources. Mom able to hand express small drops of colostrum from rightt breast/unable to express any from left.  Mom demo back breast massage and hand expression.However mom not able to remove any drops of colostrum.  Urged her to watch hand expression video.  Mom did not get anything with her inittial pumping session.  Urged mom to get pump set up at baby's bedside so she can pump there.       Maternal Data Has patient been taught Hand Expression?: Yes Does the patient have breastfeeding experience prior to this delivery?: No  Feeding    LATCH Score                   Interventions    Lactation Tools Discussed/Used Tools: Pump;Coconut oil Breast pump type: Double-Electric Breast Pump WIC Program: No Pump Review: Setup, frequency, and cleaning;Milk Storage Initiated by:: Katelyn Snow,IBCLC Snow initiated:: 03/30/18   Consult Status Consult Status: Follow-up Snow: 03/31/18 Follow-up type: In-patient    Katelyn Snow 03/30/2018, 5:12 PM

## 2018-03-30 NOTE — Transfer of Care (Signed)
Immediate Anesthesia Transfer of Care Note  Patient: Katelyn Snow  Procedure(s) Performed: CESAREAN SECTION (N/A Abdomen)  Patient Location: PACU  Anesthesia Type:Spinal  Level of Consciousness: awake, alert  and oriented  Airway & Oxygen Therapy: Patient Spontanous Breathing  Post-op Assessment: Report given to RN and Post -op Vital signs reviewed and stable  Post vital signs: Reviewed and stable  Last Vitals:  Vitals Value Taken Time  BP 189/169 03/30/2018  9:21 AM  Temp    Pulse 112 03/30/2018  9:22 AM  Resp 22 03/30/2018  9:22 AM  SpO2 100 % 03/30/2018  9:22 AM  Vitals shown include unvalidated device data.  Last Pain:  Vitals:   03/30/18 0548  TempSrc:   PainSc: 9       Patients Stated Pain Goal: 0 (03/30/18 0119)  Complications: No apparent anesthesia complications

## 2018-03-30 NOTE — Progress Notes (Signed)
PROVIDER CALLED DUE TO ONE FHR UNABLE TO TRACE.

## 2018-03-30 NOTE — Op Note (Signed)
Operative Note    Preoperative Diagnosis Preterm di/di twin pregnancy at 27 2/7 weeks Short cervix with cerclage Labor Vertex/breech presentation  Postoperative Diagnosis Same   Procedure LTCS with two layer closure of uterus Removal of cerclage  Surgeon Huel CoteKathy Husna Krone, MD  Anesthesia Spinal  Fluids: EBL 1209mL UOP 200ml IVF 2700ml  Findings Twin A, viable female Apgars 6,8 Weight 2#5oz vertex.  Twin B viable female footling breech Apgars 5,7 Weight 2#2oz  Normal appearing uterus, ovaries and tubes  Specimen Placentas to path  Procedure Note Patient was taken to the operating room where epidural anesthesia was found to be adequate by Allis clamp test. She was prepped and draped in the normal sterile fashion in the dorsal supine position with a leftward tilt. An appropriate time out was performed. A Pfannenstiel skin incision was then made with the scalpel and carried through to the underlying layer of fascia by sharp dissection and Bovie cautery. The fascia was nicked in the midline and the incision was extended laterally with Mayo scissors. The inferior aspect of the incision was grasped Coker clamps and dissected off the underlying rectus muscles. In a similar fashion the superior aspect was dissected off the rectus muscles. Rectus muscles were separated in the midline and the peritoneal cavity entered bluntly. The peritoneal incision was then extended both superiorly and inferiorly with careful attention to avoid both bowel and bladder. The Alexis self-retaining wound retractor was then placed within the incision and the lower uterine segment exposed. The bladder flap was developed with Metzenbaum scissors and pushed away from the lower uterine segment. The lower uterine segment was then incised in a transverse fashion and the cavity itself entered bluntly. The incision was extended bluntly. Twin A's  head was then lifted and delivered from the incision without difficulty. The  remainder of the infant delivered and the nose and mouth bulb suctioned with the cord clamped and cut as well after delayed clamping.  The infant was handed off to the waiting NICU team.  Twin B was then palpated and found to be footling breech.  Membranes were ruptured and he was delivered without difficulty with the head flexed.   The placentas were tagged with a clamp on B after cord blood and gases obtained. They were then spontaneously expressed from the uterus and the uterus cleared of all clots and debris with moist lap sponge. The uterine incision was then repaired in 2 layers the first layer was a running locked layer 1-0 chromic and the second an imbricating layer of the same suture. The tubes and ovaries were inspected and the gutters cleared of all clots and debris. The uterine incision was inspected and found to be hemostatic. All instruments and sponges as well as the Alexis retractor were then removed from the abdomen. The rectus muscles and peritoneum were then reapproximated with several interrupted mattress sutures of 2-0 Vicryl. The fascia was then closed with 0 Vicryl in a running fashion. Subcutaneous tissue was reapproximated with 3-0 plain in a running fashion. The skin was closed with a subcuticular stitch of 4-0 Vicryl on a Keith needle and then reinforced with benzoin and Steri-Strips. At the conclusion of the procedure all instruments and sponge counts were correct. Patient was taken to the recovery room in good condition and the babies went to NICU

## 2018-03-30 NOTE — H&P (Signed)
Katelyn Snow is a 37 y.o. female G1P0 with di/di twins at 53 2/7 weeks (EDD 06/27/18 by 6 week [place]) presenting after d/c home yesterday with painful contractions.  Prenatal care significant for h/o infertility and conceived with Femara and timed intercourse.  She had a shortened cervix with cerclage placed at 20 weeks, but has had intermittent contractions that have essentially required inpatient management x 2 weeks. The contractions have usually been manageable with procardia q 6 hours, vaginal progesterone and intermittent pain medications. Cerclage has remained intact and at most 1 cm dilated, but cervix has been 80%effaced.  She received betamethasone x 2 (03/21/18 and 03/22/18). Baby A vtx/Baby B breech on last [place] (03/23/18), concordant.   She also is likely chronic hypertensive with baseline BP 150-160/90 at intake, she is on no meds except the procardia for contractions.   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      OB History    Gravida  1   Para      Term      Preterm      AB      Living        SAB      TAB      Ectopic      Multiple      Live Births             Past Medical History:  Diagnosis Date  . Blood transfusion without reported diagnosis   . Headache(784.0)    migrains  . Hypertension   . Short cervix with cervical cerclage in second trimester, antepartum 01/25/2018   Past Surgical History:  Procedure Laterality Date  . CERVICAL CERCLAGE N/A 01/25/2018   Procedure: CERCLAGE CERVICAL;  Surgeon: Sherian Rein, MD;  Location: [facility];  Service: Gynecology;  Laterality: N/A;  . NO PAST SURGERIES     Family History: family history includes Diabetes in her maternal grandmother; Hypertension in her father, maternal grandmother, and mother. Social History:  reports that she has never smoked. She has never used smokeless tobacco. She  reports that she drank about 0.6 oz of alcohol per week. She reports that she does not use drugs.     Maternal Diabetes: No (not yet screened) Genetic Screening: Normal for DSR  Maternal Ultrasounds/Referrals: Normal Fetal Ultrasounds or other Referrals:  Referred to Materal Fetal Medicine  Maternal Substance Abuse:  No Significant Maternal Medications:  Meds include: Progesterone Other: Procardia, norco, ambien Significant Maternal Lab Results:  None Other Comments:  None  Review of Systems  Eyes: Negative for blurred vision.  Gastrointestinal: Positive for abdominal pain and vomiting.   Maternal Medical History:  Reason for admission: Contractions.   Contractions: Onset was 3-5 hours ago.   Frequency: regular.   Perceived severity is moderate.    Fetal activity: Perceived fetal activity is normal.    Prenatal complications: Bleeding.     Dilation: 1.5 Effacement (%): 90 Station: -1 Exam by:: Dr.Destine Zirkle Blood pressure (!) 128/93, pulse (!) 107, temperature 98.4 F (36.9 C), temperature source Oral, resp. rate 18, last menstrual period 09/20/2017. Maternal Exam:  Uterine Assessment: Contraction strength is moderate.  Contraction frequency is irregular.   Abdomen: Patient reports no abdominal tenderness. Fetal presentation: vertex  Introitus: Normal vulva. Normal vagina.    Physical Exam  Constitutional: She appears well-developed.  Cardiovascular: Normal rate and regular rhythm.  Respiratory: Effort normal.  Genitourinary: Vagina normal.  Neurological: She is alert.  Sleepy but easily responsive    Prenatal labs: ABO, Rh: --/--/O POS (07/20 0200) Antibody: NEG (07/20 0200) Rubella: Immune (04/01 0000) RPR: Nonreactive (04/01 0000)  HBsAg: Negative (04/01 0000)  HIV: Non-reactive (04/01 0000)  GBS:   unknown First trimester screen negative for DSR (twins)  Assessment/Plan: Pt here on readmission with painful contractions.  Received stadol x 2 and started  on magnesium for tocolysis/neuroprotection.  Bedside US confirms vertex/breech.  D/w pt that she is on maximum tocolysis and if breaks through with further dilation we will need to proceed with c-section so will keep her NPO.  NICU notified of potential delivery.  S/p betamethasone x 2 03/21/18 and 03/22/18.  FHR category 1 x 2 when on monitor.   Katelyn Snow 03/30/2018, 6:16 AM

## 2018-03-30 NOTE — MAU Note (Signed)
[facility]. Returns now with ctxs more than 10 an hour.

## 2018-03-30 NOTE — Progress Notes (Signed)
Patient ID: Katelyn Snow, female   DOB: August 24, 1981, 37 y.o.   MRN: [id number] Pt increasingly uncomfortable, FHR difficult to keep on but baby A 130-140 and Baby B 120  Cervix now c/2/-1  D/w pt and husband she is progressing despite maximum intervention and we need to proceed with c-section for delivery. NICU and anesthesia aware.   Pt had expressed desire for tubal sterilization, we discussed that maybe we should delay this given 27 week delivery and pt agreeable. Pt being prepped for OR. Risks and benefits of c-section reviewed with pt including bleeding, infection and possible damage to bowel and bladder.  Pt desires to proceed

## 2018-03-30 NOTE — Anesthesia Preprocedure Evaluation (Signed)
Anesthesia Evaluation  Patient identified by MRN, date of birth, ID band Patient awake    Reviewed: Allergy & Precautions, NPO status , Patient's Chart, lab work & pertinent test results, reviewed documented beta blocker date and time   Airway Mallampati: III  TM Distance: >3 FB Neck ROM: Full  Mouth opening: Limited Mouth Opening  Dental no notable dental hx. (+) Teeth Intact   Pulmonary neg pulmonary ROS,    Pulmonary exam normal breath sounds clear to auscultation       Cardiovascular hypertension, Pt. on medications and Pt. on home beta blockers Normal cardiovascular exam Rhythm:Regular Rate:Normal  Pre eclampsia superimposed on cHTN   Neuro/Psych  Headaches, negative psych ROS   GI/Hepatic Neg liver ROS, GERD  Medicated and Controlled,  Endo/Other  Obesity  Renal/GU negative Renal ROS  negative genitourinary   Musculoskeletal negative musculoskeletal ROS (+)   Abdominal (+) + obese,   Peds  Hematology  (+) anemia ,   Anesthesia Other Findings   Reproductive/Obstetrics (+) Pregnancy Twin gestation in labor Transverse/Vertex 27 weeks AMA Preterm labor Hx/o cervical cerclage for short cervix                             Anesthesia Physical Anesthesia Plan  ASA: III and emergent  Anesthesia Plan: Spinal   Post-op Pain Management:    Induction:   PONV Risk Score and Plan: Treatment may vary due to age or medical condition, Ondansetron, Dexamethasone and Scopolamine patch - Pre-op  Airway Management Planned: Natural Airway  Additional Equipment:   Intra-op Plan:   Post-operative Plan:   Informed Consent: I have reviewed the patients History and Physical, chart, labs and discussed the procedure including the risks, benefits and alternatives for the proposed anesthesia with the patient or authorized representative who has indicated his/her understanding and acceptance.    Dental advisory given  Plan Discussed with: CRNA and Surgeon  Anesthesia Plan Comments:         Anesthesia Quick Evaluation

## 2018-03-30 NOTE — Anesthesia Procedure Notes (Signed)
Spinal  Patient location during procedure: OR Start time: 03/30/2018 7:50 AM Staffing Anesthesiologist: Mal AmabileFoster, Wylodean Shimmel, MD Performed: anesthesiologist  Preanesthetic Checklist Completed: patient identified, site marked, surgical consent, pre-op evaluation, timeout performed, IV checked, risks and benefits discussed and monitors and equipment checked Spinal Block Patient position: sitting Prep: site prepped and draped and DuraPrep Patient monitoring: heart rate, cardiac monitor, continuous pulse ox and blood pressure Approach: midline Location: L4-5 Injection technique: single-shot Needle Needle type: Pencan  Needle gauge: 24 G Needle length: 9 cm Needle insertion depth: 6 cm Assessment Sensory level: T4 Additional Notes Patient tolerated procedure well. Adequate sensory level.

## 2018-03-30 NOTE — MAU Provider Note (Signed)
Chief Complaint:  Contractions   First Provider Initiated Contact with Patient 03/30/18 0136      HPI: Katelyn Snow is a 37 y.o. G1P0 at 34w2dwho presents to maternity admissions reporting uterine contractions which have become more frequent and painful.. She reports good fetal movement, denies LOF, vaginal bleeding, vaginal itching/burning, urinary symptoms, h/a, dizziness, n/v, diarrhea, constipation or fever/chills.  She denies headache, visual changes or RUQ abdominal pain.  Abdominal Pain  This is a recurrent problem. The current episode started today. The problem occurs intermittently. The problem has been gradually worsening. The quality of the pain is cramping. The abdominal pain does not radiate. Pertinent negatives include no constipation, diarrhea, dysuria, fever, frequency or headaches. Nothing aggravates the pain. The pain is relieved by nothing. Treatments tried: Procardia. The treatment provided no relief.   RN Note: Just went home from [facility] Friday where had been treated for PTL. Returns now with ctxs more than 10 an hour.     Past Medical History: Past Medical History:  Diagnosis Date  . Blood transfusion without reported diagnosis   . Headache(784.0)    migrains  . Hypertension   . Short cervix with cervical cerclage in second trimester, antepartum 01/25/2018    Past obstetric history: OB History  Gravida Para Term Preterm AB Living  1            SAB TAB Ectopic Multiple Live Births               # Outcome Date GA Lbr Len/2nd Weight Sex Delivery Anes PTL Lv  1 Current             Past Surgical History: Past Surgical History:  Procedure Laterality Date  . CERVICAL CERCLAGE N/A 01/25/2018   Procedure: CERCLAGE CERVICAL;  Surgeon: Sherian Rein, MD;  Location: [facility];  Service: Gynecology;  Laterality: N/A;  . NO PAST SURGERIES      Family History: Family History  Problem Relation Age of Onset  . Hypertension Mother   . Hypertension Father   .  Hypertension Maternal Grandmother   . Diabetes Maternal Grandmother     Social History: Social History   Tobacco Use  . Smoking status: Never Smoker  . Smokeless tobacco: Never Used  Substance Use Topics  . Alcohol use: Not Currently    Alcohol/week: 0.6 oz    Types: 1 Standard drinks or equivalent per week  . Drug use: No    Allergies: No Known Allergies  Meds:  Medications Prior to Admission  Medication Sig Dispense Refill Last Dose  . acetaminophen (TYLENOL) 325 MG tablet Take 2 tablets (650 mg total) by mouth every 4 (four) hours as needed (for pain scale < 4  OR  temperature  >/=  100.5 F). 30 tablet 0 Past Week at Unknown time  . ferrous sulfate 325 (65 FE) MG tablet Take 325 mg by mouth daily with breakfast.   Past Month at Unknown time  . HYDROcodone-acetaminophen (NORCO/VICODIN) 5-325 MG tablet Take 2 tablets by mouth at bedtime. 20 tablet 0 03/29/2018 at Unknown time  . NIFEdipine (PROCARDIA) 20 MG capsule Take 1 capsule (20 mg total) by mouth every 6 (six) hours. 30 capsule 1 03/29/2018 at Unknown time  . pantoprazole (PROTONIX) 40 MG tablet Take 1 tablet (40 mg total) by mouth daily. 30 tablet 1 03/29/2018 at Unknown time  . Prenatal Vit-Fe Fumarate-FA (PRENATAL MULTIVITAMIN) TABS tablet Take 1 tablet by mouth 3 (three) times a week.    03/29/2018 at  Unknown time  . progesterone (PROMETRIUM) 200 MG capsule Place 1 capsule (200 mg total) vaginally daily at 10 pm. 30 capsule 1 03/29/2018 at Unknown time  . zolpidem (AMBIEN) 5 MG tablet Take 1 tablet (5 mg total) by mouth at bedtime as needed for sleep. 30 tablet 0 03/29/2018 at Unknown time    I have reviewed patient's Past Medical Hx, Surgical Hx, Family Hx, Social Hx, medications and allergies.   ROS:  Review of Systems  Constitutional: Negative for fever.  Gastrointestinal: Positive for abdominal pain. Negative for constipation and diarrhea.  Genitourinary: Negative for dysuria and frequency.  Neurological: Negative for  headaches.   Other systems negative  Physical Exam   Patient Vitals for the past 24 hrs:  BP Temp Pulse Resp  03/30/18 0116 (!) 148/83 98.5 F (36.9 C) (!) 104 (!) 22   Vitals:   03/30/18 0623 03/30/18 0646 03/30/18 0650 03/30/18 0701  BP: (!) 144/84   (!) 163/97  Pulse: (!) 107   (!) 135  Resp: (!) 22   (!) 22  Temp:      TempSrc:      SpO2: 94% 95% 94%     Constitutional: Well-developed, well-nourished female in no acute distress.  Cardiovascular: normal rate and rhythm Respiratory: normal effort, clear to auscultation bilaterally GI: Abd soft, non-tender, gravid appropriate for gestational age.   No rebound or guarding. MS: Extremities nontender, no edema, normal ROM Neurologic: Alert and oriented x 4.  GU: Neg CVAT.  PELVIC EXAM: Sterile speculum exam done.  Cervix is open but stitch is not visible.  There is moderately large amount of light red thick discharge.  Digital exam done to confirm presence of cerclage which is palpable with knot at 12:00.  Dilation: 1 Effacement (%): 80 Station: -1 Exam by:: Wynelle BourgeoisMarie Williams CNM   FHT:  Baseline 145 (both twins), moderate variability, accelerations present, no decelerations Contractions: q 2 mins Irregular    Labs: Results for orders placed or performed during the hospital encounter of 03/30/18 (from the past 24 hour(s))  CBC     Status: Abnormal   Collection Time: 03/30/18  2:00 AM  Result Value Ref Range   WBC 14.9 (H) 4.0 - 10.5 K/uL   RBC 4.50 3.87 - 5.11 MIL/uL   Hemoglobin 10.2 (L) 12.0 - 15.0 g/dL   HCT 16.130.9 (L) 09.636.0 - 04.546.0 %   MCV 68.7 (L) 78.0 - 100.0 fL   MCH 22.7 (L) 26.0 - 34.0 pg   MCHC 33.0 30.0 - 36.0 g/dL   RDW 40.919.2 (H) 81.111.5 - 91.415.5 %   Platelets 394 150 - 400 K/uL  Comprehensive metabolic panel     Status: Abnormal   Collection Time: 03/30/18  2:00 AM  Result Value Ref Range   Sodium 134 (L) 135 - 145 mmol/L   Potassium 3.6 3.5 - 5.1 mmol/L   Chloride 103 98 - 111 mmol/L   CO2 20 (L) 22 - 32  mmol/L   Glucose, Bld 111 (H) 70 - 99 mg/dL   BUN 5 (L) 6 - 20 mg/dL   Creatinine, Ser 7.820.72 0.44 - 1.00 mg/dL   Calcium 9.0 8.9 - 95.610.3 mg/dL   Total Protein 6.8 6.5 - 8.1 g/dL   Albumin 2.9 (L) 3.5 - 5.0 g/dL   AST 18 15 - 41 U/L   ALT 11 0 - 44 U/L   Alkaline Phosphatase 95 38 - 126 U/L   Total Bilirubin 0.6 0.3 - 1.2 mg/dL   GFR  calc non Af Amer >60 >60 mL/min   GFR calc Af Amer >60 >60 mL/min   Anion gap 11 5 - 15  Protein / creatinine ratio, urine     Status: Abnormal   Collection Time: 03/30/18  2:00 AM  Result Value Ref Range   Creatinine, Urine 107.00 mg/dL   Total Protein, Urine 24 mg/dL   Protein Creatinine Ratio 0.22 (H) 0.00 - 0.15 mg/mg[Cre]  Type and screen [facility]     Status: None   Collection Time: 03/30/18  2:00 AM  Result Value Ref Range   ABO/RH(D) O POS    Antibody Screen NEG    Sample Expiration      04/02/2018 Performed at [facility], [address], [place], [place] [address]     --/--/O POS (07/16 1037)  Imaging:    MAU Course/MDM: I have ordered labs and reviewed results. Preeclampsia labs drawn due to elevated blood pressures.These were normal.   NST reviewed and is reassuring We began by giving her Stadol and Phenergan which did diminish her contractions greatly.  But they returned later.  So decision was made to proceed with MgSO4 tocolysis and admission.  Consult Dr Senaida Ores with presentation, exam findings and test results.    Assessment: 1. Short cervix with cervical cerclage in second trimester, antepartum   2. Short cervical length during pregnancy in second trimester   3. Chronic hypertension during pregnancy, antepartum   4.      Preterm labor 5.      Di-Di Twin gestation  Plan: Admit to antenatal/Labor per Dr Senaida Ores Magnesium sulfate infusion MD to follow.  Wynelle Bourgeois CNM, MSN Certified Nurse-Midwife 03/30/2018 2:06 AM

## 2018-03-31 LAB — CBC
HCT: 24.3 % — ABNORMAL LOW (ref 36.0–46.0)
Hemoglobin: 8.1 g/dL — ABNORMAL LOW (ref 12.0–15.0)
MCH: 23.1 pg — ABNORMAL LOW (ref 26.0–34.0)
MCHC: 32.9 g/dL (ref 30.0–36.0)
MCV: 70 fL — AB (ref 78.0–100.0)
PLATELETS: 323 10*3/uL (ref 150–400)
RBC: 3.47 MIL/uL — AB (ref 3.87–5.11)
RDW: 19 % — ABNORMAL HIGH (ref 11.5–15.5)
WBC: 17 10*3/uL — ABNORMAL HIGH (ref 4.0–10.5)

## 2018-03-31 LAB — COMPREHENSIVE METABOLIC PANEL
ALT: 10 U/L (ref 0–44)
AST: 15 U/L (ref 15–41)
Albumin: 2.5 g/dL — ABNORMAL LOW (ref 3.5–5.0)
Alkaline Phosphatase: 70 U/L (ref 38–126)
Anion gap: 9 (ref 5–15)
BUN: 5 mg/dL — ABNORMAL LOW (ref 6–20)
CALCIUM: 8.8 mg/dL — AB (ref 8.9–10.3)
CO2: 24 mmol/L (ref 22–32)
CREATININE: 0.64 mg/dL (ref 0.44–1.00)
Chloride: 102 mmol/L (ref 98–111)
GFR calc Af Amer: >60 mL/min (ref >60)
GFR calc non Af Amer: >60 mL/min (ref >60)
Glucose, Bld: 93 mg/dL (ref 70–99)
Potassium: 4 mmol/L (ref 3.5–5.1)
SODIUM: 135 mmol/L (ref 135–145)
TOTAL PROTEIN: 6.1 g/dL — AB (ref 6.5–8.1)
Total Bilirubin: 0.5 mg/dL (ref 0.3–1.2)

## 2018-03-31 NOTE — Progress Notes (Signed)
Subjective: Postpartum Day 1: Cesarean Delivery Patient reports tolerating PO and no problems voiding.  Ambulating fine  Objective: Vital signs in last 24 hours: Temp:  [97.7 F (36.5 C)-98.2 F (36.8 C)] 97.7 F (36.5 C) (07/21 0802) Pulse Rate:  [79-113] 96 (07/21 0802) Resp:  [14-18] 18 (07/21 0802) BP: (117-148)/(73-95) 117/87 (07/21 0802) SpO2:  [90 %-100 %] 100 % (07/21 0802)  Physical Exam:  General: alert and cooperative Lochia: appropriate Uterine Fundus: firm Incision: C/D/I   Recent Labs    03/30/18 0200 03/31/18 0647  HGB 10.2* 8.1*  HCT 30.9* 24.3*    Assessment/Plan: Status post Cesarean section. Doing well postoperatively.  Continue current care. Po iron for relative anemia, doing well D/w pt interval BTL given prematurity of babies  Oliver PilaKathy W Lama Narayanan 03/31/2018, 11:28 AM

## 2018-03-31 NOTE — Addendum Note (Signed)
Addendum  created 03/31/18 0807 by Rica Recordsickelton, Keeara Frees, CRNA   Sign clinical note

## 2018-03-31 NOTE — Lactation Note (Signed)
This note was copied from a baby's chart. Lactation Consultation Note  Patient Name: Girl Eliezer ChampagneShetarra Vanegas ZOXWR'UToday's Date: 03/31/2018    [facility], but Mom had recently left to go to the NICU. Staff at desk made aware that I would be happy to come back and see her when she returns.   Lurline HareRichey, Perlie Scheuring [facility] 03/31/2018, 7:16 PM

## 2018-03-31 NOTE — Anesthesia Postprocedure Evaluation (Signed)
Anesthesia Post Note  Patient: Katelyn Snow  Procedure(s) Performed: CESAREAN SECTION (N/A Abdomen)     Patient location during evaluation: Women's Unit Anesthesia Type: Spinal Level of consciousness: oriented and awake and alert Pain management: pain level controlled Vital Signs Assessment: post-procedure vital signs reviewed and stable Respiratory status: spontaneous breathing, respiratory function stable and patient connected to nasal cannula oxygen Cardiovascular status: blood pressure returned to baseline and stable Postop Assessment: no headache, no backache, no apparent nausea or vomiting and patient able to bend at knees Anesthetic complications: no    Last Vitals:  Vitals:   03/31/18 0424 03/31/18 0802  BP: 123/73 117/87  Pulse: 80 96  Resp: 16 18  Temp: 36.6 C 36.5 C  SpO2: 100% 100%    Last Pain:  Vitals:   03/31/18 0802  TempSrc: Oral  PainSc:    Pain Goal: Patients Stated Pain Goal: 4 (03/30/18 2243)               Rica RecordsICKELTON,Chayah Mckee

## 2018-03-31 NOTE — Progress Notes (Signed)
Encouraged to be assisted to use bathroom and try to void.  Patient refused.  Encouraged fluid intake.

## 2018-03-31 NOTE — Progress Notes (Signed)
Patient has refused blood drawn at this time even after explanation on blood work to be done.

## 2018-04-01 MED ORDER — MAGNESIUM HYDROXIDE 400 MG/5ML PO SUSP
30.0000 mL | Freq: Every day | ORAL | Status: DC | PRN
  Administered 2018-04-01: 30 mL via ORAL
  Filled 2018-04-01: qty 30

## 2018-04-01 NOTE — Progress Notes (Signed)
POD #2 Doing well, sore, babies stable in NICU Afeb, VSS Abd- soft, fundus firm, incision intact Continue routine care

## 2018-04-01 NOTE — Lactation Note (Signed)
This note was copied from a baby's chart. Lactation Consultation Note  Patient Name: Girl Eliezer ChampagneShetarra Nick [id number]'AToday's Date: 04/01/2018    Mom was preparing to pump as I entered room, b/c she had felt something "wet" from her breast. I assisted her with pumping; size 24 flanges are appropriate for her at this time. Hand expression was taught to Mom & it was done after using the DEBP. We were able to obtain a total of about 6 mLs, which was split into 2 separate vials for the twins. Yellow colostrum stickers were provided & I showed her how to use them. The colostrum vials were placed in the breast milk refrigerator until Mom is ready to visit the NICU.   Mom pumped 5 times yesterday, but there was no yield. Mom also did not know she could turn the suction up on the DEBP to test her comfort level. Importance of milk expression to help her milk come to volume/maximize supply was discussed.   Mom has WIC w/the [place] office. [facility] & will have her husband bring $30 cash. [facility]   Lurline HareRichey, Judy Goodenow [facility] 04/01/2018, 11:38 AM

## 2018-04-02 MED ORDER — LABETALOL HCL 100 MG PO TABS
100.0000 mg | ORAL_TABLET | Freq: Two times a day (BID) | ORAL | Status: DC
  Administered 2018-04-02 – 2018-04-03 (×3): 100 mg via ORAL
  Filled 2018-04-02 (×3): qty 1

## 2018-04-02 NOTE — Progress Notes (Signed)
Patient ID: Katelyn Snow, female   DOB: October 28, 1980, 37 y.o.   MRN: [id number] BP improving Last check was 139/92 Pt still asymptomatic Continue routine care

## 2018-04-02 NOTE — Lactation Note (Signed)
This note was copied from a baby's chart. Lactation Consultation Note  Patient Name: Katelyn Snow Samyah Wakeley [id number]'s Date: 04/02/2018   Twin 1574 hours old.  27w2 day.  Mother getting ready to go upstairs and bring colostrum to infants. Praised her for her efforts.   Mother has been pumping 15-20 ml. Encouraged her to pump q 2.5-3 hours during the day. Discussed pumping room on 2nd floor.  Mother states [facility] stated they will be bringing her a DEBP for discharge. Provided mother w/ additional bottles and colostrum containers for discharge. Suggest RN speak to mother about engorgement and milk storage.      Maternal Data    Feeding    LATCH Score                   Interventions    Lactation Tools Discussed/Used     Consult Status      Hardie PulleyBerkelhammer, Pamula Luther Boschen 04/02/2018, 10:50 AM

## 2018-04-02 NOTE — Progress Notes (Signed)
Patient ID: Katelyn Snow, female   DOB: 11/08/1980, 37 y.o.   MRN: [id number] POD#3 Pt with elevated DBP this am. Repeat now and still elev ( 95-99). She denies HA or blurry vision Recommend pt stay until tomorrow for BP maintenance.  Counsel on risks for PIH/PreE postpartum and need for good control prior to discharge.  Will start on labetalol now: 100 mg BID BP check q 4hrs  Possible discharge to home in am

## 2018-04-03 MED ORDER — IBUPROFEN 600 MG PO TABS: 600.0000 mg | ORAL_TABLET | Freq: Four times a day (QID) | ORAL | 0 refills | Status: DC

## 2018-04-03 MED ORDER — LABETALOL HCL 100 MG PO TABS: 100.0000 mg | ORAL_TABLET | Freq: Two times a day (BID) | ORAL | 1 refills | Status: DC

## 2018-04-03 NOTE — Progress Notes (Signed)
Patient discharged home with family. Follow-up care reviewed; discharge instructions reviewed; prescriptions reviewed; pain management discussed; educated about hypertension in pregnancy; admission discussed; medications discussed. Patient verbalized understanding.

## 2018-04-03 NOTE — Discharge Summary (Signed)
OB Discharge Summary     Patient Name: Katelyn Snow DOB: [birth year] MRN: [id number]  Date of admission: 03/30/2018 Delivering MD:    [name], Girl Katelyn [[id number]][[id number]]  Beatris [name], Snow   [name], [name] [[id number]][[id number]]  [name], Snow   Date of discharge: 04/03/2018  Admitting diagnosis: 27 WKS, CTX Intrauterine pregnancy: 3678w6d     Secondary diagnosis:  Active Problems:   Preterm labor   S/P primary low transverse C-section      Discharge diagnosis: Preterm Pregnancy Delivered and di/di twins, PTL, incompetent cervix, [facility]                                  Hospital course:  Onset of Labor With Unplanned C/S  37 y.o. yo G1P0 at 3678w6d was admitted in Latent Labor on 03/30/2018. Patient had a labor course significant for persistent contractions with vtx/breech presentation with preterm di/di twins. Membrane Rupture Time/Date:    [name], Girl Snow [[id number]][[id number]]  8:14 AM   [name], BoyB [facility] Snow [[id number]][[id number]]  8:17 AM ,   [name], Girl Karie Snow [[id number]][[id number]]  03/30/2018   [name], BoyB Snow [[id number]][[id number]]  03/30/2018   The patient [facility] with [facility] and delivered  Viable infants,   [name], Girl Snow [[id number]][[id number]]  03/30/2018   [name], BoyB Snow [[id number]][[id number]]  03/30/2018  Details of operation can be found in separate operative note. Patient had an uncomplicated postpartum course except for mildly increased BP on POD #3, started on Labetalol 100 mg bid with adequate control.  She is ambulating,tolerating a regular diet, passing flatus, and urinating well.  Patient is discharged home in stable condition 04/03/18.  Physical exam  Vitals:   04/02/18 1558 04/02/18 2035 04/02/18 2349 04/03/18 0520  BP: (!) 139/92 (!) 137/93 118/75 126/83  Pulse: 98 91 (!) 107 (!) 103  Resp: 18 17 15 16   Temp: 98.6 F (37 C) 98.9 F (37.2 C) 98.9 F (37.2 C) 98 F (36.7 C)  TempSrc: Oral Oral Oral Oral  SpO2: 100% 100% 99% 99%   Weight:      Height:       General: alert Lochia: appropriate Uterine Fundus: firm Incision: Healing well with no significant drainage  Labs: Lab Results  Component Value Date   WBC 17.0 (H) 03/31/2018   HGB 8.1 (L) 03/31/2018   HCT 24.3 (L) 03/31/2018   MCV 70.0 (L) 03/31/2018   PLT 323 03/31/2018   CMP Latest Ref Rng & Units 03/31/2018  Glucose 70 - 99 mg/dL 93  BUN 6 - 20 mg/dL 5(L)  Creatinine 0.160.44 - 1.00 mg/dL 0.100.64  Sodium 932135 - 355145 mmol/L 135  Potassium 3.5 - 5.1 mmol/L 4.0  Chloride 98 - 111 mmol/L 102  CO2 22 - 32 mmol/L 24  Calcium 8.9 - 10.3 mg/dL 7.3(U8.8(L)  Total Protein 6.5 - 8.1 g/dL 6.1(L)  Total Bilirubin 0.3 - 1.2 mg/dL 0.5  Alkaline Phos 38 - 126 U/L 70  AST 15 - 41 U/L 15  ALT 0 - 44 U/L 10    Discharge instruction: per After Visit Summary and "Baby and Me Booklet".  After visit meds:  Allergies as of 04/03/2018   No Known Allergies     Medication List    STOP taking these medications   acetaminophen 325 MG tablet Commonly known as:  TYLENOL   HYDROcodone-acetaminophen 5-325 MG tablet Commonly known as:  NORCO/VICODIN  NIFEdipine 20 MG capsule Commonly known as:  PROCARDIA   progesterone 200 MG capsule Commonly known as:  PROMETRIUM     TAKE these medications   ferrous sulfate 325 (65 FE) MG tablet Take 325 mg by mouth daily with breakfast.   ibuprofen 600 MG tablet Commonly known as:  ADVIL,MOTRIN Take 1 tablet (600 mg total) by mouth every 6 (six) hours.   labetalol 100 MG tablet Commonly known as:  NORMODYNE Take 1 tablet (100 mg total) by mouth 2 (two) times daily.   pantoprazole 40 MG tablet Commonly known as:  PROTONIX Take 1 tablet (40 mg total) by mouth daily.   prenatal multivitamin Tabs tablet Take 1 tablet by mouth 3 (three) times a week.   zolpidem 5 MG tablet Commonly known as:  AMBIEN Take 1 tablet (5 mg total) by mouth at bedtime as needed for sleep.       Diet: routine diet  Activity: Advance as  tolerated. Pelvic rest for 6 weeks.   Outpatient follow up:one week  Newborn Data:   Katelyn, Snow [[id number]]  Live born female  Birth Weight: 2 lb 5.4 oz (1060 g) APGAR: 6, 8  Newborn Delivery   Birth date/time:  03/30/2018 08:15:00 Delivery type:  C-Section, Low Transverse Trial of labor:  No C-section categorization:  Primary      Katelyn, Snow [[id number]]  Live born female  Birth Weight: 2 lb 2.9 oz (990 g) APGAR: 5, 7  Newborn Delivery   Birth date/time:  03/30/2018 08:18:00 Delivery type:  C-Section, Low Transverse Trial of labor:  No C-section categorization:  Primary     Baby Feeding: Breast Disposition:NICU   04/03/2018 Zenaida Niece, MD

## 2018-04-03 NOTE — Discharge Instructions (Signed)
As per discharge pamphlet °

## 2018-04-03 NOTE — Progress Notes (Signed)
POD #4 Doing well, babies stable in NICU Afeb, VSS Fundus firm, incision intact BP controlled with low dose labetalol, will d/c home

## 2018-09-19 ENCOUNTER — Encounter (HOSPITAL_COMMUNITY): Payer: Self-pay

## 2019-04-07 ENCOUNTER — Emergency Department (HOSPITAL_COMMUNITY)
Admission: EM | Admit: 2019-04-07 | Discharge: 2019-04-07 | Disposition: A | Discharge status: Home / Self Care | Payer: BC Managed Care – PPO | Attending: Emergency Medicine | Admitting: Emergency Medicine

## 2019-04-07 ENCOUNTER — Encounter (HOSPITAL_COMMUNITY): Payer: Self-pay | Admitting: Emergency Medicine

## 2019-04-07 ENCOUNTER — Other Ambulatory Visit: Payer: Self-pay

## 2019-04-07 DIAGNOSIS — N939 Abnormal uterine and vaginal bleeding, unspecified: Secondary | ICD-10-CM | POA: Diagnosis present

## 2019-04-07 DIAGNOSIS — R1030 Lower abdominal pain, unspecified: Secondary | ICD-10-CM | POA: Insufficient documentation

## 2019-04-07 DIAGNOSIS — Z79899 Other long term (current) drug therapy: Secondary | ICD-10-CM | POA: Diagnosis not present

## 2019-04-07 DIAGNOSIS — N938 Other specified abnormal uterine and vaginal bleeding: Secondary | ICD-10-CM | POA: Diagnosis not present

## 2019-04-07 DIAGNOSIS — I10 Essential (primary) hypertension: Secondary | ICD-10-CM | POA: Diagnosis not present

## 2019-04-07 LAB — PROTIME-INR
INR: 1 (ref 0.8–1.2)
Prothrombin Time: 13.2 seconds (ref 11.4–15.2)

## 2019-04-07 LAB — URINALYSIS, ROUTINE W REFLEX MICROSCOPIC
Bacteria, UA: NONE SEEN
Bilirubin Urine: NEGATIVE
Glucose, UA: NEGATIVE mg/dL
Ketones, ur: NEGATIVE mg/dL
Leukocytes,Ua: NEGATIVE
Nitrite: NEGATIVE
Protein, ur: NEGATIVE mg/dL
Specific Gravity, Urine: 1.003 — ABNORMAL LOW (ref 1.005–1.030)
pH: 8 (ref 5.0–8.0)

## 2019-04-07 LAB — BASIC METABOLIC PANEL
Anion gap: 8 (ref 5–15)
BUN: 8 mg/dL (ref 6–20)
CO2: 22 mmol/L (ref 22–32)
Calcium: 9.3 mg/dL (ref 8.9–10.3)
Chloride: 108 mmol/L (ref 98–111)
Creatinine, Ser: 0.83 mg/dL (ref 0.44–1.00)
GFR calc Af Amer: >60 mL/min (ref >60)
GFR calc non Af Amer: >60 mL/min (ref >60)
Glucose, Bld: 125 mg/dL — ABNORMAL HIGH (ref 70–99)
Potassium: 3.5 mmol/L (ref 3.5–5.1)
Sodium: 138 mmol/L (ref 135–145)

## 2019-04-07 LAB — I-STAT BETA HCG BLOOD, ED (MC, WL, AP ONLY): I-stat hCG, quantitative: <5 m[IU]/mL (ref <5)

## 2019-04-07 LAB — WET PREP, GENITAL
Clue Cells Wet Prep HPF POC: NONE SEEN
Sperm: NONE SEEN
Trich, Wet Prep: NONE SEEN
Yeast Wet Prep HPF POC: NONE SEEN

## 2019-04-07 LAB — CBC
HCT: 30.1 % — ABNORMAL LOW (ref 36.0–46.0)
Hemoglobin: 9.5 g/dL — ABNORMAL LOW (ref 12.0–15.0)
MCH: 22.7 pg — ABNORMAL LOW (ref 26.0–34.0)
MCHC: 31.6 g/dL (ref 30.0–36.0)
MCV: 72 fL — ABNORMAL LOW (ref 80.0–100.0)
Platelets: 339 10*3/uL (ref 150–400)
RBC: 4.18 MIL/uL (ref 3.87–5.11)
RDW: 17.2 % — ABNORMAL HIGH (ref 11.5–15.5)
WBC: 8.2 10*3/uL (ref 4.0–10.5)
nRBC: 0 % (ref 0.0–0.2)

## 2019-04-07 LAB — TYPE AND SCREEN
ABO/RH(D): O POS
Antibody Screen: NEGATIVE

## 2019-04-07 LAB — ABO/RH: ABO/RH(D): O POS

## 2019-04-07 MED ORDER — SODIUM CHLORIDE 0.9 % IV BOLUS
1000.0000 mL | Freq: Once | INTRAVENOUS | Status: AC
  Administered 2019-04-07: 1000 mL via INTRAVENOUS

## 2019-04-07 NOTE — ED Notes (Signed)
Pt.dressed into a gown and hooked to the monitor

## 2019-04-07 NOTE — ED Provider Notes (Signed)
[facility] Note   CSN: [id number] Arrival date & time: 04/07/19  0359    History   Chief Complaint Chief Complaint  Patient presents with  . Vaginal Bleeding    HPI Katelyn Snow is a 38 y.o. female.     38 y/o female with hx of HTN presents to the ED for vaginal bleeding. Patient is a poor historian 2/2 poor effort. She communicates to me that she has been having vaginal bleeding for "3 weeks, maybe more". States that her bleeding was initially manageable, but she went to see her OBGYN who did an ultrasound to assess her persistent bleeding. Patient states that this imaging was notable for an abnormally thickened endometrial stripe. Patient was placed on a 10 day course of progesterone after her US, but bleeding became more heavy following completion of progesterone. She has been passing blood and clots, using a whole box of pads today. Saw her OBGYN again on Friday and was started on Junel OCP. She has taken 2 doses of this medication with no slowing of her bleeding. Went to use the bathroom tonight. Had some lower abdominal cramping followed by a bowel movement. She began to feel lightheaded when sitting on the toilet with increased fatigue. Did not syncopize. Did have some nausea, but denies vomiting. No associated fever or urinary symptoms. She has no c/o abdominal pain currently.  Abdominal surgical history significant for cesarean section x2.  She reports hx of blood transfusion 2/2 menorrhagia, but not for many years. OBGYN - Dr. Senaida [facility]ichardson [facility]   Vaginal Bleeding   Past Medical History:  Diagnosis Date  . Blood transfusion without reported diagnosis   . Headache(784.0)    migrains  . Hypertension   . Short cervix with cervical cerclage in second trimester, antepartum 01/25/2018    Patient Active Problem List   Diagnosis Date Noted  . Preterm labor 03/30/2018  . S/P primary low transverse C-section  03/30/2018  . Threatened preterm labor, second trimester 03/21/2018  . Short cervix with cervical cerclage in second trimester, antepartum 01/25/2018  . Short cervical length during pregnancy in second trimester 01/24/2018  . Chronic hypertension during pregnancy, antepartum 11/14/2017  . Subchorionic hematoma in first trimester 11/14/2017  . New onset headache 04/10/2014    Past Surgical History:  Procedure Laterality Date  . CERVICAL CERCLAGE N/A 01/25/2018   Procedure: CERCLAGE CERVICAL;  Surgeon: Sherian ReinBovard-Stuckert, Jody, MD;  Location: [facility] ORS;  Service: Gynecology;  Laterality: N/A;  . CESAREAN SECTION N/A 03/30/2018   Procedure: CESAREAN SECTION;  Surgeon: [name], [name], MD;  [facility];  Service: Obstetrics;  Laterality: N/A;  . NO PAST SURGERIES       OB History    Gravida  1   Para      Term      Preterm      AB      Living        SAB      TAB      Ectopic      Multiple      Live Births               Home Medications    Prior to Admission medications   Medication Sig Start Date End Date Taking? Authorizing Provider  amLODipine (NORVASC) 10 MG tablet Take 10 mg by mouth daily. 03/21/19  Yes [provider]  hydrochlorothiazide (HYDRODIURIL) 25 MG tablet Take 25 mg by mouth daily. 04/04/19  Yes [provider]  JUNEL FE 1/20 1-20 MG-MCG tablet Take 1 tablet by mouth every evening.  04/05/19  Yes [provider]  ibuprofen (ADVIL,MOTRIN) 600 MG tablet Take 1 tablet (600 mg total) by mouth every 6 (six) hours. Patient not taking: Reported on 04/07/2019 04/03/18   Meisinger, Sherren Mocha, MD  labetalol (NORMODYNE) 100 MG tablet Take 1 tablet (100 mg total) by mouth 2 (two) times daily. Patient not taking: Reported on 04/07/2019 04/03/18   Meisinger, Sherren Mocha, MD  pantoprazole (PROTONIX) 40 MG tablet Take 1 tablet (40 mg total) by mouth daily. Patient not taking: Reported on 04/07/2019 03/29/18   Paula Compton, MD  zolpidem  (AMBIEN) 5 MG tablet Take 1 tablet (5 mg total) by mouth at bedtime as needed for sleep. Patient not taking: Reported on 04/07/2019 03/29/18   Paula Compton, MD    Family History Family History  Problem Relation Age of Onset  . Hypertension Mother   . Hypertension Father   . Hypertension Maternal Grandmother   . Diabetes Maternal Grandmother     Social History Social History   Tobacco Use  . Smoking status: Never Smoker  . Smokeless tobacco: Never Used  Substance Use Topics  . Alcohol use: Not Currently    Alcohol/week: 1.0 standard drinks    Types: 1 Standard drinks or equivalent per week  . Drug use: No     Allergies   Patient has no known allergies.   Review of Systems Review of Systems  Genitourinary: Positive for vaginal bleeding.  Ten systems reviewed and are negative for acute change, except as noted in the HPI.     Physical Exam Updated Vital Signs BP 134/79   Pulse 84   Temp 97.6 F (36.4 C) (Oral)   Resp 20   SpO2 100%   Physical Exam Vitals signs and nursing note reviewed.  Constitutional:      General: She is not in acute distress.    Appearance: She is well-developed. She is not diaphoretic.     Comments: Patient appears fatigued, but nontoxic  HENT:     Head: Normocephalic and atraumatic.  Eyes:     General: No scleral icterus.    Conjunctiva/sclera: Conjunctivae normal.  Neck:     Musculoskeletal: Normal range of motion.  Cardiovascular:     Rate and Rhythm: Regular rhythm.     Pulses: Normal pulses.     Comments: Intermittent, mild tachycardia Pulmonary:     Effort: Pulmonary effort is normal. No respiratory distress.     Breath sounds: No stridor. No wheezing.     Comments: Respirations even and unlabored Abdominal:     Comments: Soft, nondistended abdomen.  Abdomen is obese.  There is no focal tenderness.  No peritoneal signs.  Genitourinary:    Comments: Small amount of blood in vaginal vault. No clots. Bleeding coming from  cervical os; scant. No CMT, adnexal TTP. No palpable pelvic masses though exam limited 2/2 habitus. Musculoskeletal: Normal range of motion.  Skin:    General: Skin is warm and dry.     Coloration: Skin is not pale.     Findings: No erythema or rash.  Neurological:     General: No focal deficit present.     Mental Status: She is alert and oriented to person, place, and time.     Coordination: Coordination normal.  Psychiatric:        Behavior: Behavior normal.      ED Treatments / Results  Labs (all labs  ordered are listed, but only abnormal results are displayed) Labs Reviewed  WET PREP, GENITAL - Abnormal; Notable for the following components:      Result Value   WBC, Wet Prep HPF POC MODERATE (*)    All other components within normal limits  CBC - Abnormal; Notable for the following components:   Hemoglobin 9.5 (*)    HCT 30.1 (*)    MCV 72.0 (*)    MCH 22.7 (*)    RDW 17.2 (*)    All other components within normal limits  BASIC METABOLIC PANEL - Abnormal; Notable for the following components:   Glucose, Bld 125 (*)    All other components within normal limits  URINALYSIS, ROUTINE W REFLEX MICROSCOPIC - Abnormal; Notable for the following components:   Color, Urine COLORLESS (*)    Specific Gravity, Urine 1.003 (*)    Hgb urine dipstick SMALL (*)    All other components within normal limits  URINE CULTURE  PROTIME-INR  I-STAT BETA HCG BLOOD, ED ([facility], [facility], [facility] ONLY)  TYPE AND SCREEN  ABO/RH    EKG None  Radiology No results found.  Procedures Procedures (including critical care time)  Medications Ordered in ED Medications  sodium chloride 0.9 % bolus 1,000 mL (1,000 mLs Intravenous New Bag/Given 04/07/19 0501)     Initial Impression / Assessment and Plan / ED Course  I have reviewed the triage vital signs and the nursing notes.  Pertinent labs & imaging results that were available during my care of the patient were reviewed by me and considered in my  medical decision making (see chart for details).        38 year old female presents to the emergency department for evaluation of ongoing menorrhagia.  This has been followed by her OB/GYN and she was started on Junel OCPs 2 days ago.  She has minimal blood in the vaginal vault, no clots.  Denies any significant or persistent abdominal pain.  Came in for lightheadedness without syncope prior to arrival.  Patient with stable vital signs.  No persistent tachycardia.  Hemoglobin improved compared to prior.  Do not feel any further emergent work-up is indicated at this time.  The patient was hydrated with 1 L of IV fluids.  She has been instructed to continue her OCPs as prescribed by her OB/GYN.  Return precautions discussed and provided. Patient discharged in stable condition with no unaddressed concerns.   Final Clinical Impressions(s) / ED Diagnoses   Final diagnoses:  Dysfunctional uterine bleeding    ED Discharge Orders    None       Antony MaduraHumes, Malerie Eakins, PA-C 04/07/19 [id number]    Nira Connardama, Pedro Eduardo, MD 04/07/19 [phone]

## 2019-04-07 NOTE — ED Triage Notes (Signed)
Pt here from home with c/o vaginal bleed times 1 week , pt has already been seen for this same thing by her primary care

## 2019-04-07 NOTE — Discharge Instructions (Signed)
Continue Junel as prescribed by your OBGYN. Continue to remain well hydrated by drinking plenty of fluids. You can follow up with  your primary care doctor as needed for ongoing complaints. Return to the ED for any new or concerning symptoms.

## 2019-04-07 NOTE — ED Notes (Signed)
Patient verbalizes understanding of discharge instructions. Opportunity for questioning and answers were provided. Armband removed by staff, pt discharged from ED.  

## 2019-04-08 LAB — URINE CULTURE: Culture: NO GROWTH

## 2019-05-27 ENCOUNTER — Other Ambulatory Visit (HOSPITAL_COMMUNITY): Payer: Self-pay | Admitting: Obstetrics and Gynecology

## 2019-05-27 ENCOUNTER — Other Ambulatory Visit: Payer: Self-pay | Admitting: Obstetrics and Gynecology

## 2019-05-27 DIAGNOSIS — N939 Abnormal uterine and vaginal bleeding, unspecified: Secondary | ICD-10-CM

## 2019-07-02 IMAGING — US US MFM OB LIMITED
1 series · 13 of 13 positions shown · non-contrast
Comparison: none

[Series 1: us mfm ob limited · 13 acquisitions, 13 frames shown]
[im 1/13]
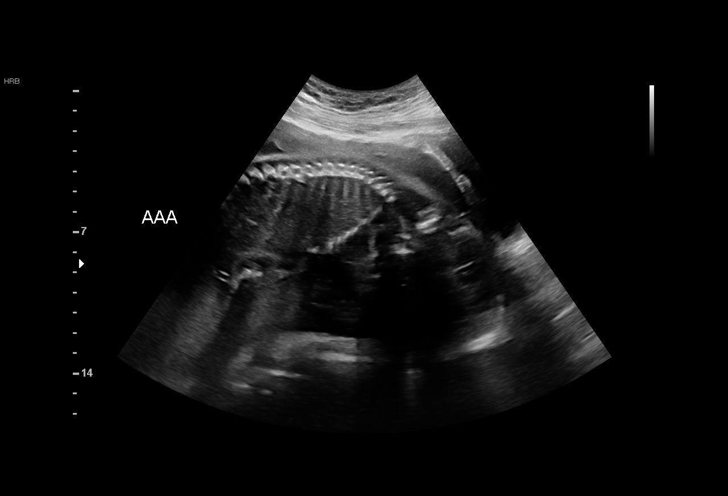
[im 2/13]
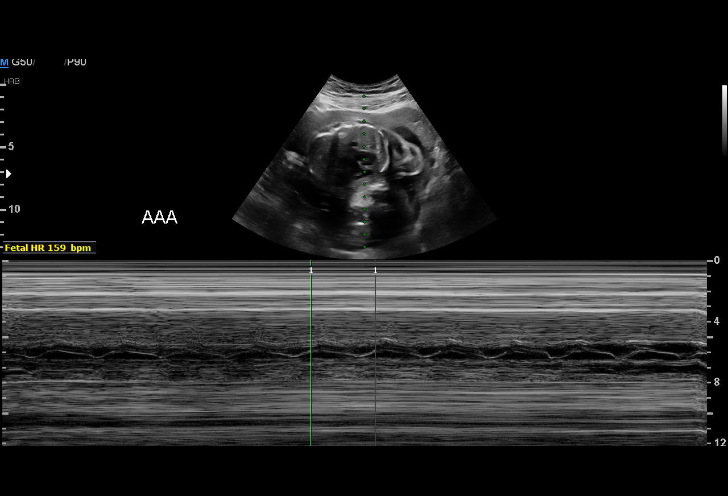
[im 3/13]
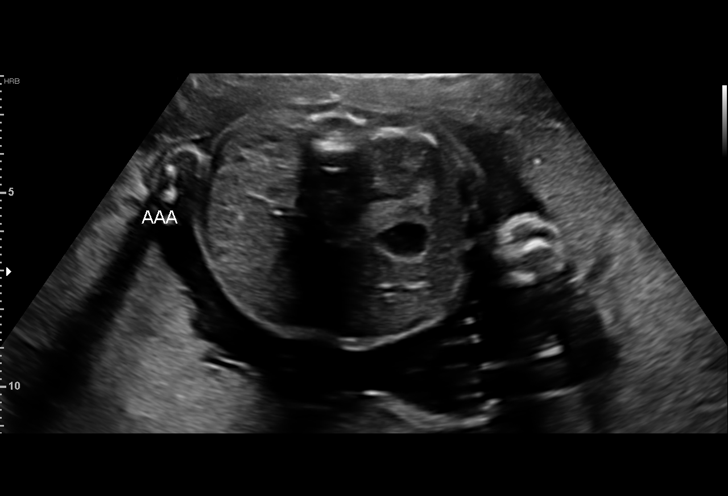
[im 4/13]
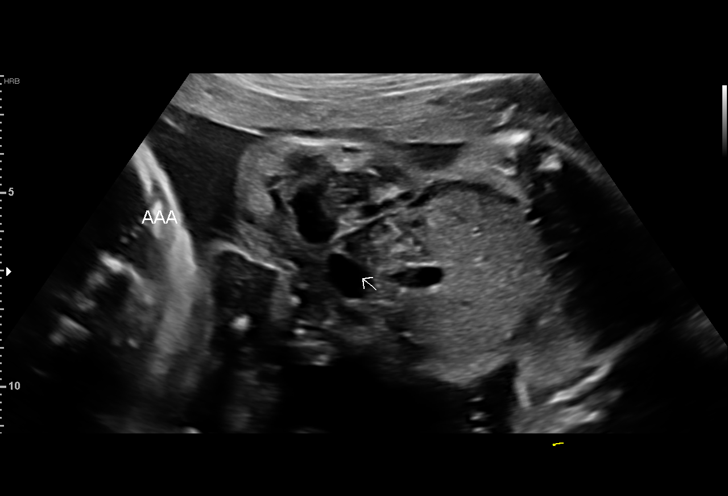
[im 5/13]
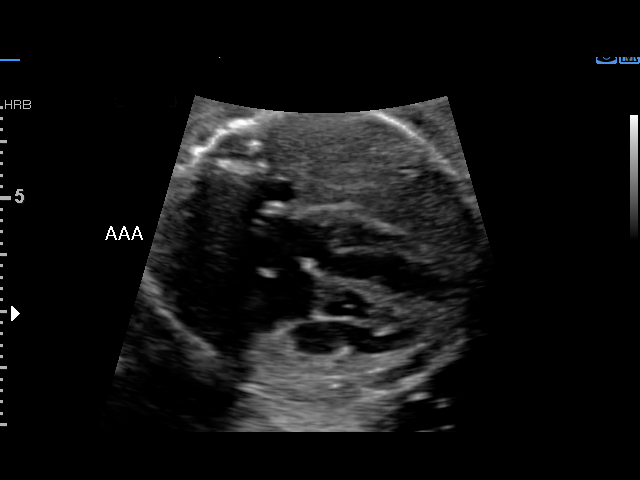
[im 6/13]
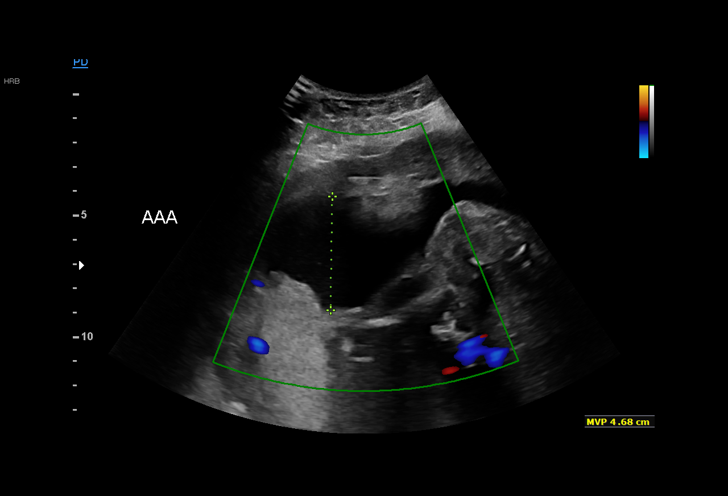
[im 7/13]
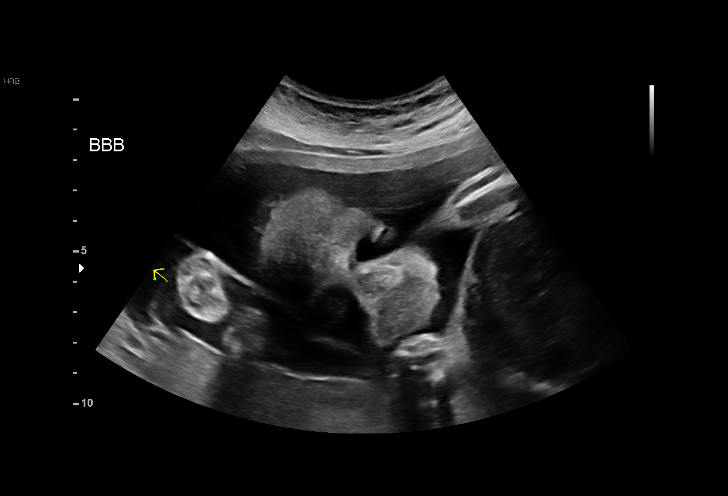
[im 8/13]
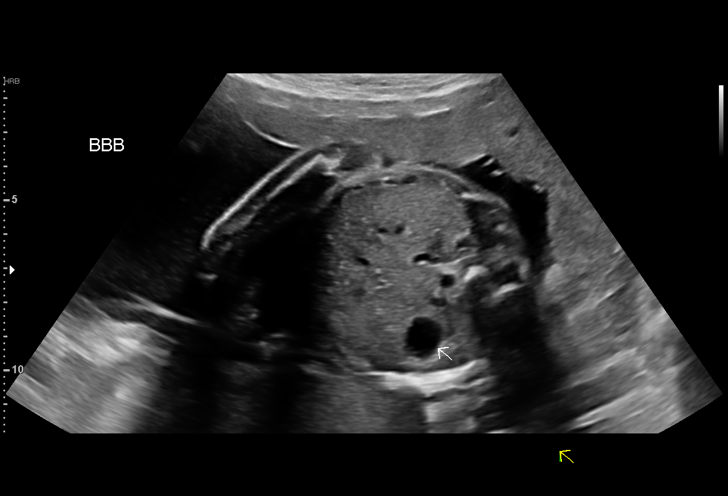
[im 9/13]
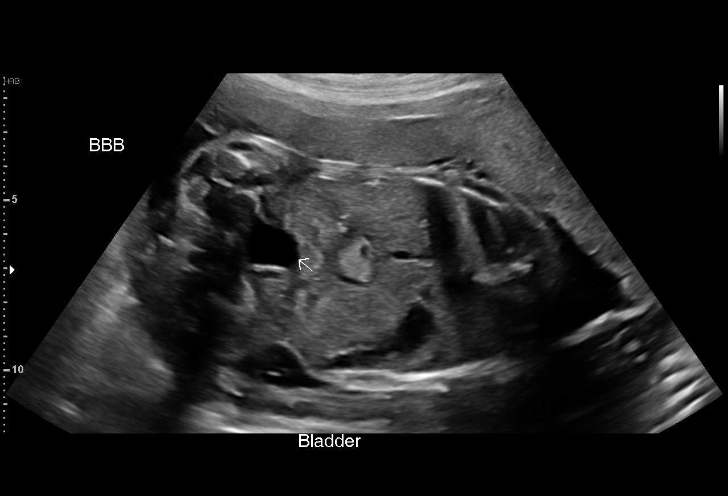
[im 10/13]
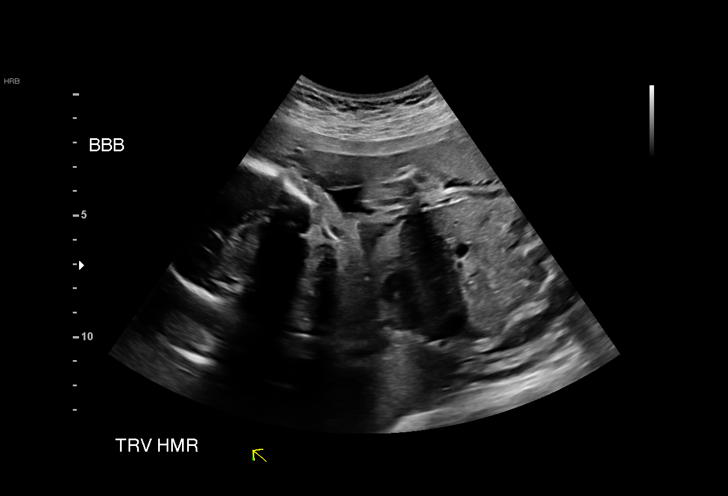
[im 11/13]
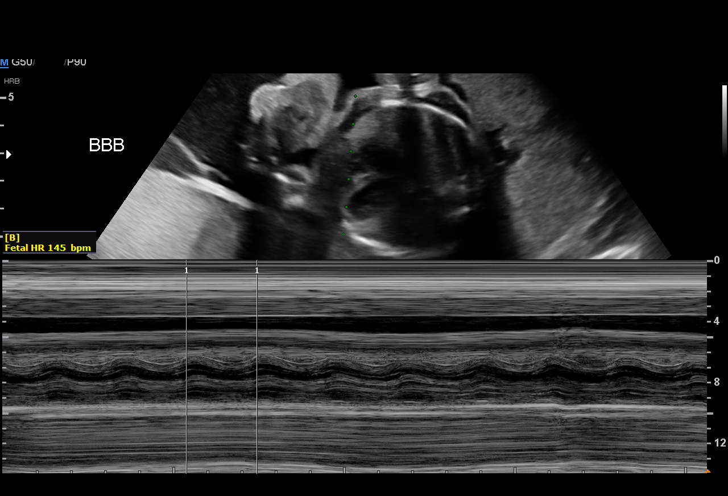
[im 12/13]
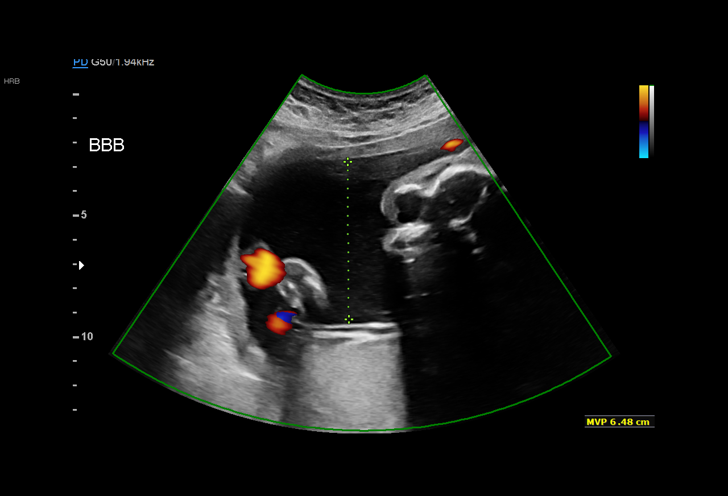
[im 13/13]
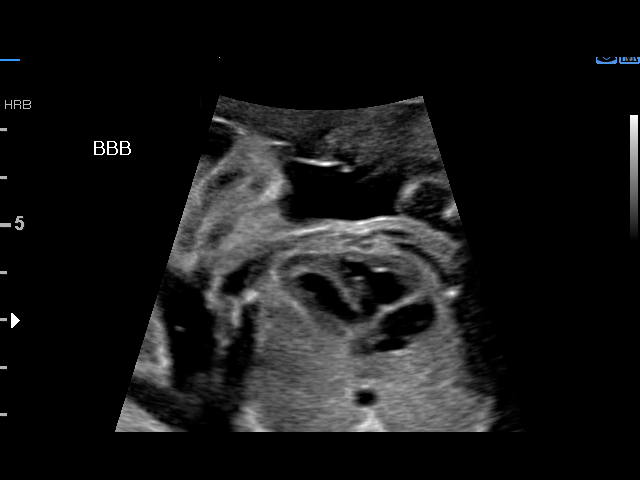

[13 of 13 positions shown; findings below may reference images not displayed]

1  KLPIGBB MOOLMAN           [id number]      [id number]     [id number]
Indications

26 weeks gestation of pregnancy
Twin pregnancy, di/di, second trimester
Premature rupture of membranes - leaking
fluid
Hypertension - Chronic/Pre-existing
OB History

Blood Type:            Height:  5'2"   Weight (lb):  200       BMI:
Gravidity:    1
Fetal Evaluation (Fetus A)

Num Of Fetuses:     2
Fetal Heart         159
Rate(bpm):
Cardiac Activity:   Observed
Fetal Lie:          Lower Fetus
Presentation:       Cephalic

Amniotic Fluid
AFI FV:      Subjectively within normal limits

Largest Pocket(cm)
4.68
Gestational Age (Fetus A)

LMP:           26w 0d        Date:  09/20/17                 EDD:   06/27/18
Best:          26w 0d     Det. By:  LMP  (09/20/17)          EDD:   06/27/18
Anatomy (Fetus A)
Thoracic:              Appears normal         Abdomen:                Appears normal
Stomach:               Appears normal, left   Bladder:                Appears normal
sided

Fetal Evaluation (Fetus B)

Num Of Fetuses:     2
Fetal Heart         145
Rate(bpm):
Cardiac Activity:   Observed
Presentation:       Transverse, head to maternal right

Amniotic Fluid
AFI FV:      Subjectively within normal limits

Largest Pocket(cm)
6.48
Gestational Age (Fetus B)

LMP:           26w 0d        Date:  09/20/17                 EDD:   06/27/18
Best:          26w 0d     Det. By:  LMP  (09/20/17)          EDD:   06/27/18
Anatomy (Fetus B)

Thoracic:              Appears normal         Abdomen:                Appears normal
Stomach:               Appears normal, left   Bladder:                Appears normal
sided
Cervix Uterus Adnexa

Cervix
Not visualized (advanced GA >47wks)
Impression

Dichorionic-diamniotic twin pregnancy.
Patient is admitted with the diagnosis of PPROM.

Ultrasound shows good fetal heart activity in both twins.
Amniotic fluid is normal in both sacs.

## 2019-07-04 IMAGING — US US RENAL
1 series · 15 of 25 positions shown · non-contrast
Comparison: CT, 09/15/2016

CLINICAL DATA: Twenty-six week pregnant patient with flank pain.

EXAM:
RENAL / URINARY TRACT ULTRASOUND COMPLETE

[Series 1: us renal · 15 of 42 slices shown]
[im 1/42]
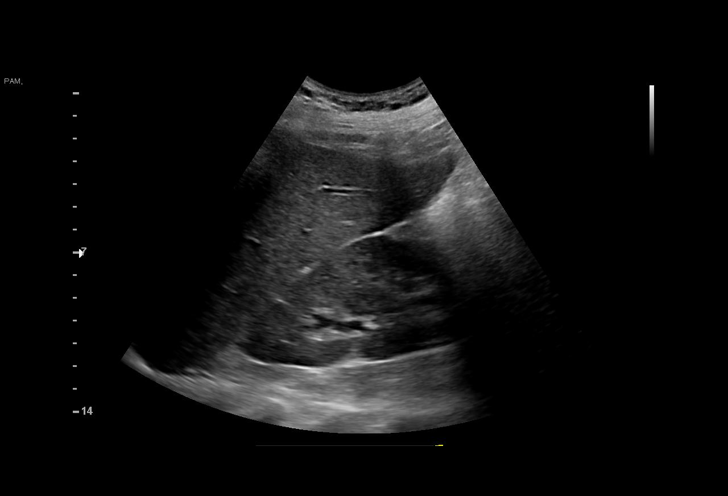
[im 4/42]
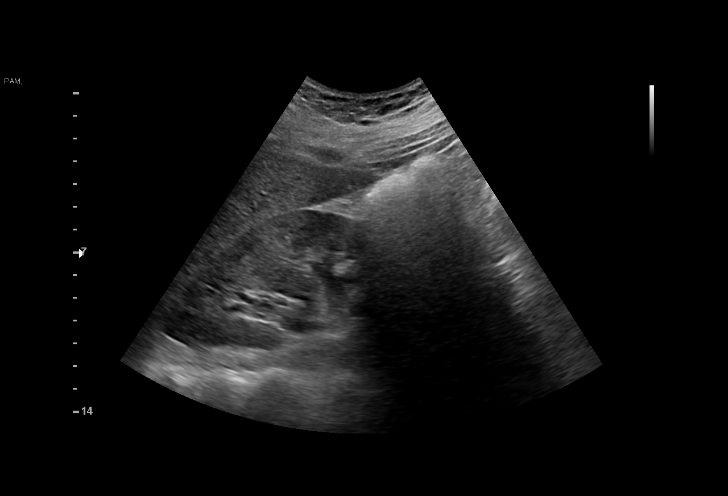
[im 7/42]
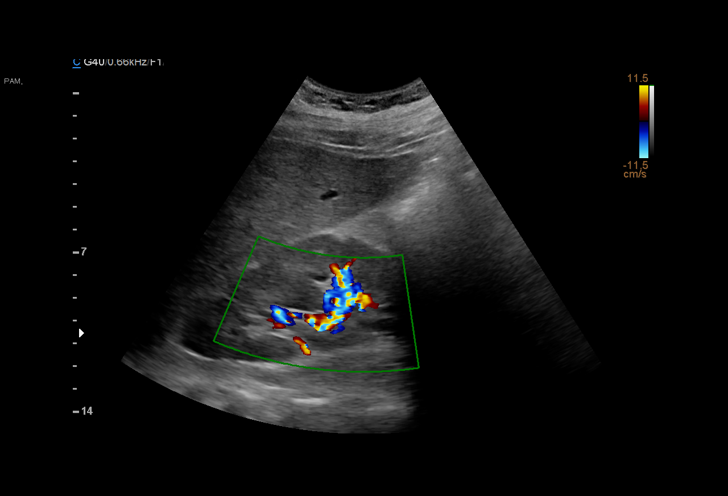
[im 9/42]
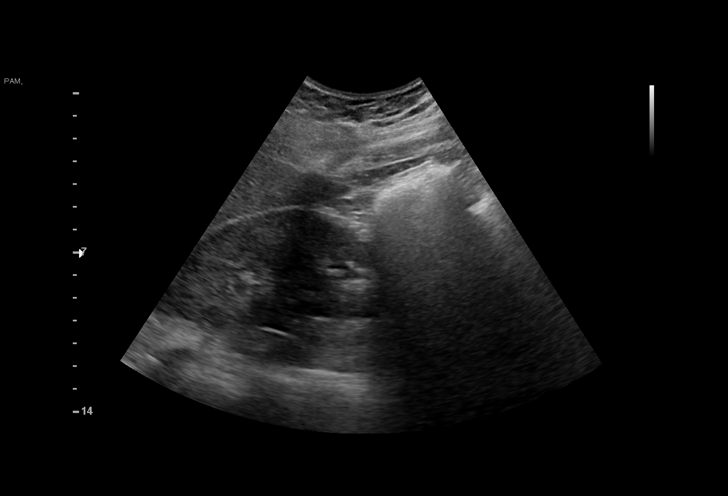
[im 12/42]
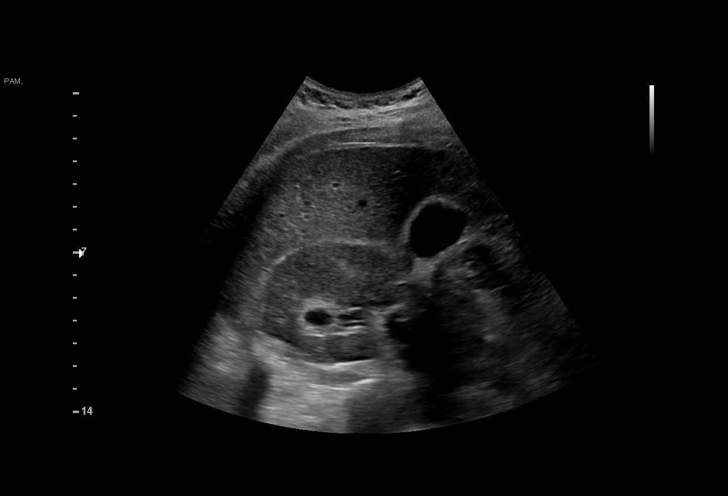
[im 16/42]
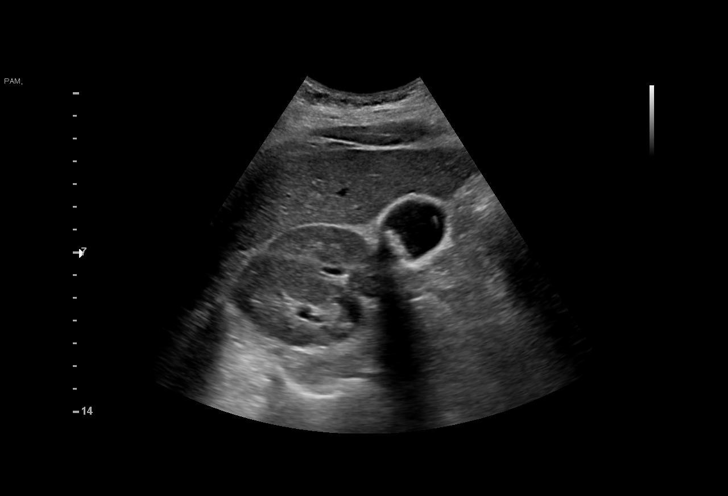
[im 18/42]
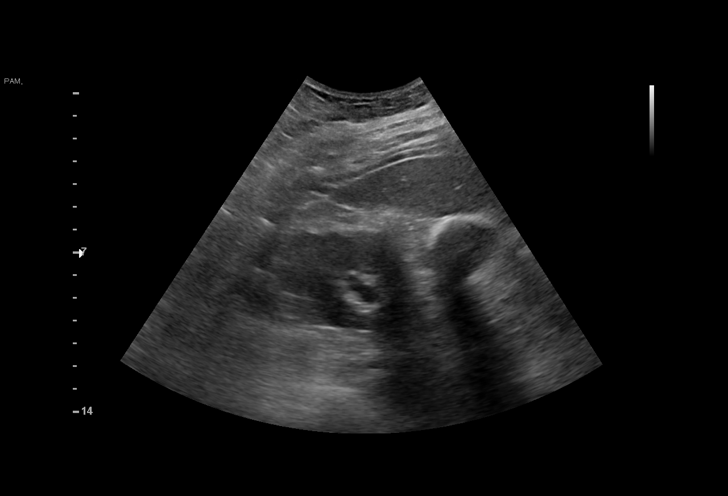
[im 21/42]
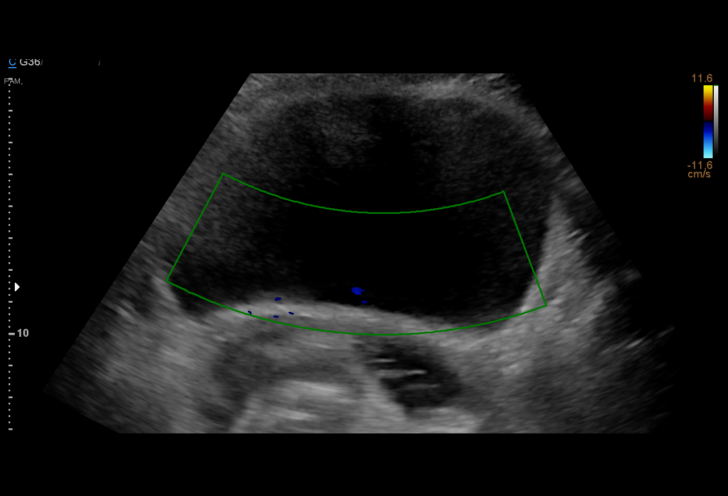
[im 24/42]
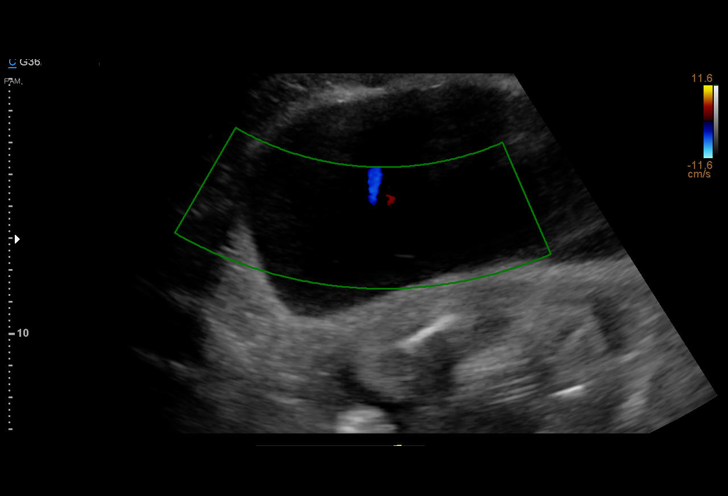
[im 26/42]
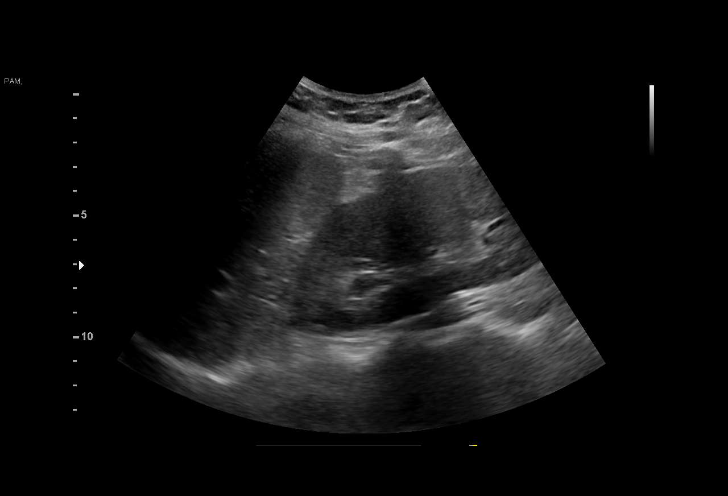
[im 30/42]
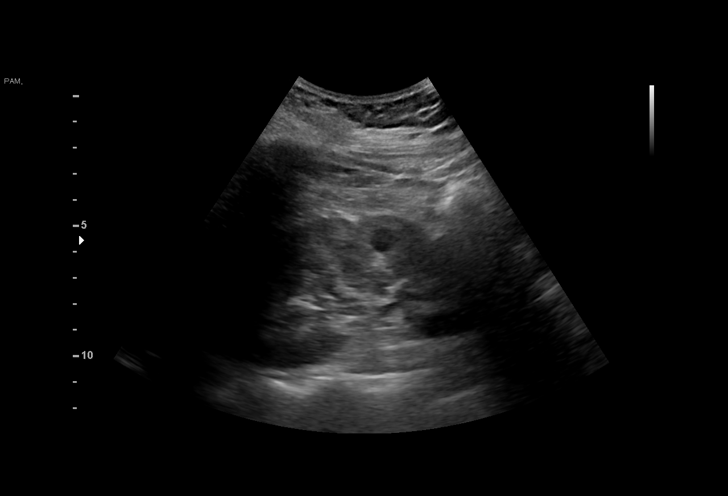
[im 33/42]
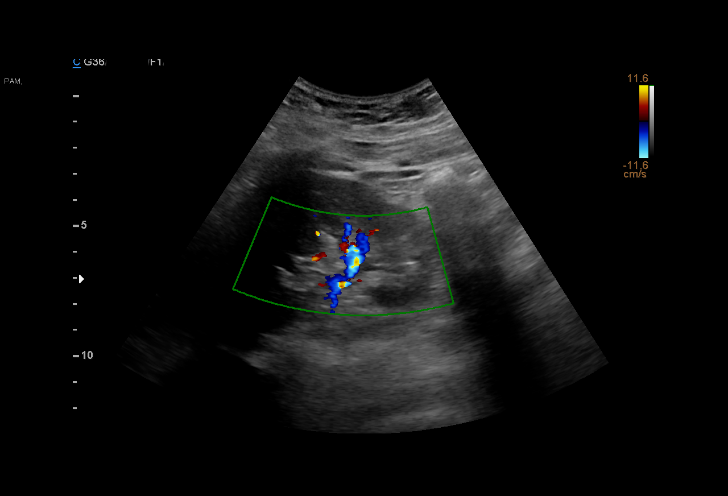
[im 35/42]
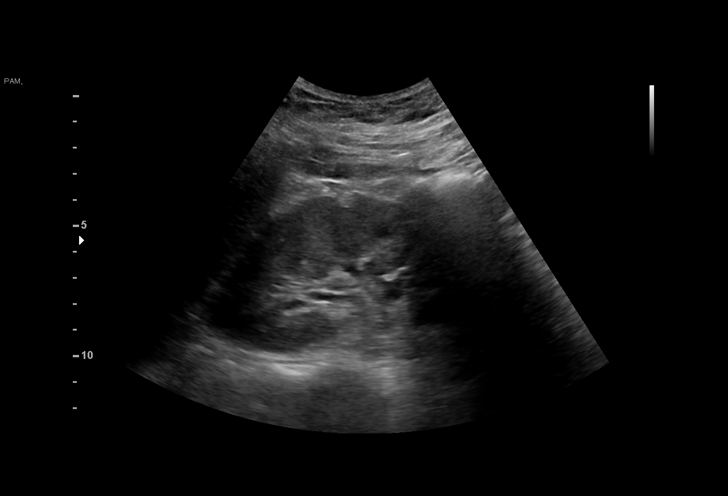
[im 38/42]
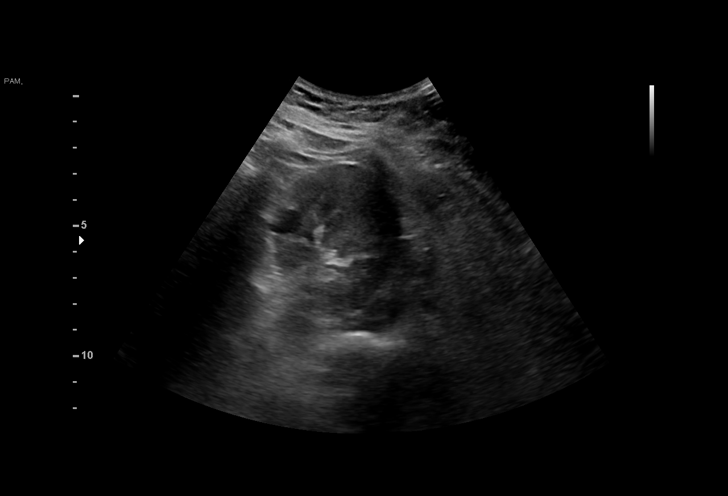
[im 42/42]
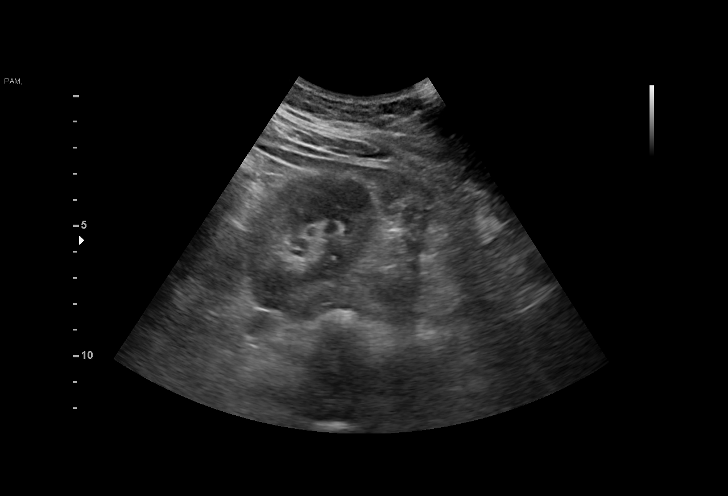

[15 of 25 positions shown; findings below may reference images not displayed]

FINDINGS: Right Kidney:

Length: 11.8 cm. Echogenicity within normal limits. No mass or
hydronephrosis visualized.

Left Kidney:

Length: 12.2 cm. Normal parenchymal echogenicity. Small, 1 cm,
midpole cyst. No other masses. No stones. No hydronephrosis.

Bladder:

Appears normal for degree of bladder distention.

Incidental note is made of gallstones.
IMPRESSION: 1. No acute findings.  No hydronephrosis.
2. 1 cm left renal cyst.  Gallstones.  No other abnormalities.

## 2019-07-06 IMAGING — US US MFM OB DETAIL EACH ADDL GEST+14 WK
1 series · 15 of 28 positions shown · non-contrast
Comparison: none

[Series 1: us mfm ob detail each addl gest+14 wk · 104 acquisitions, 15 frames shown]
[im 1/104]
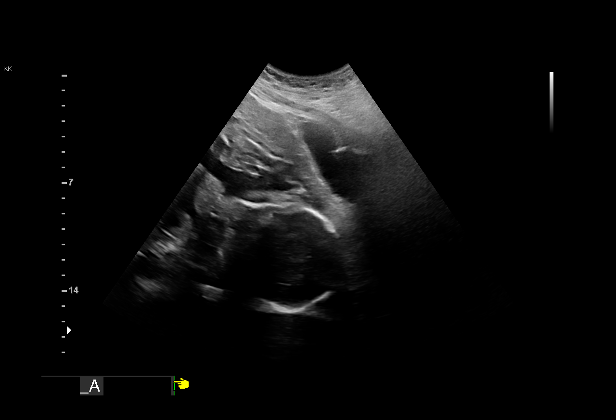
[im 8/104]
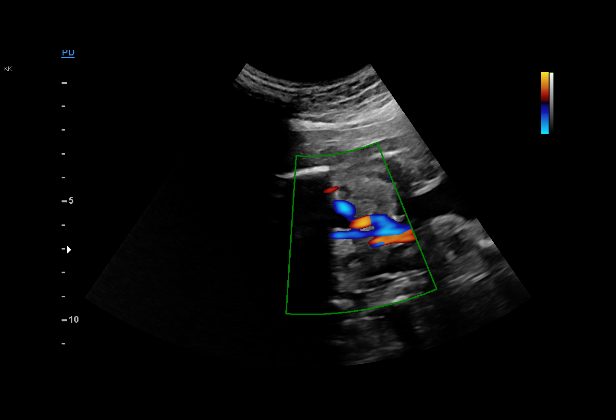
[im 16/104]
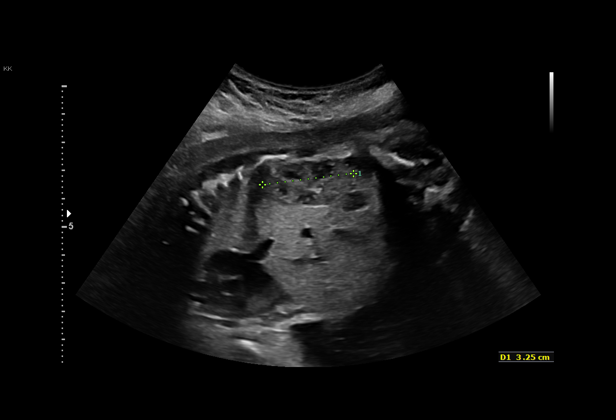
[im 23/104]
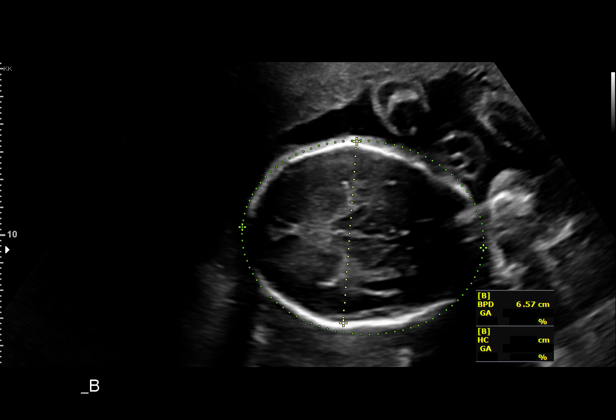
[im 31/104]
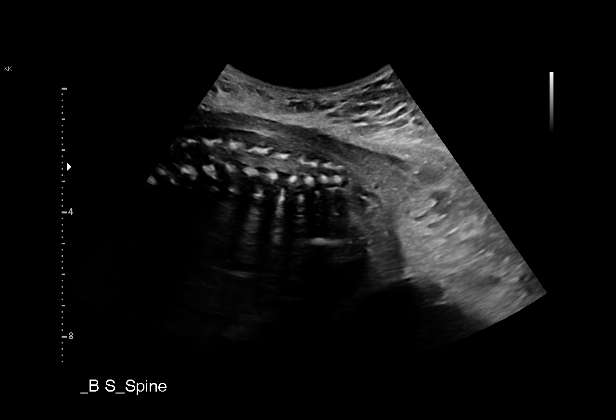
[im 39/104]
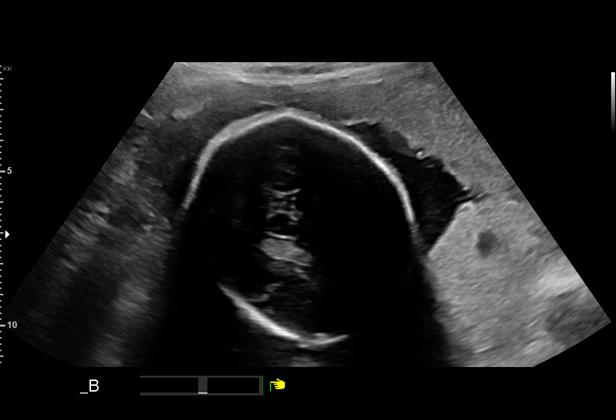
[im 46/104]
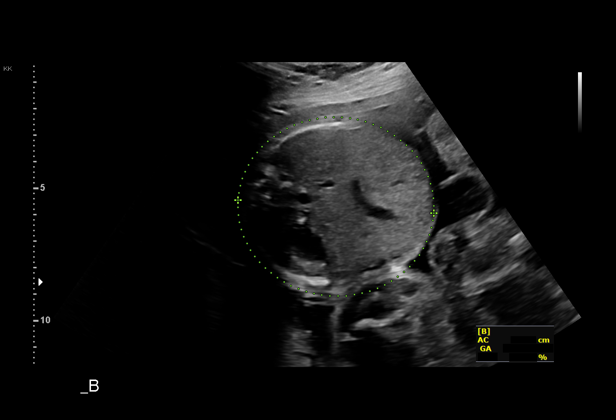
[im 54/104]
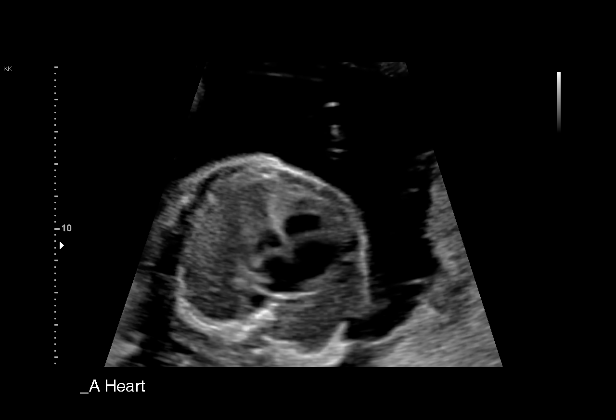
[im 58/104]
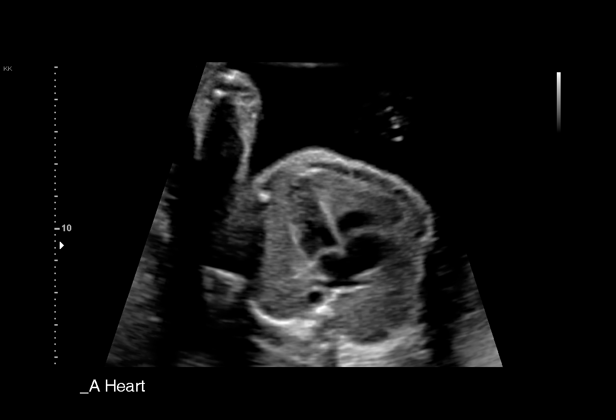
[im 65/104]
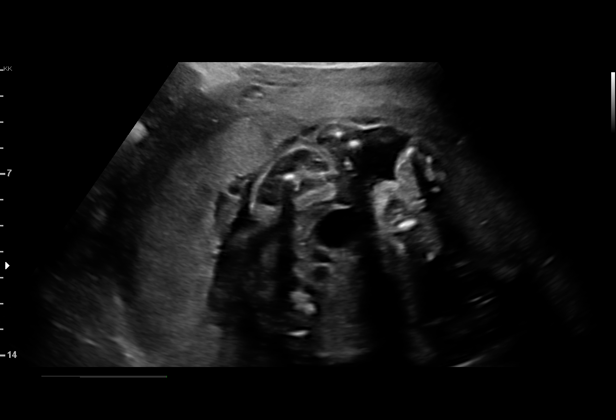
[im 73/104]
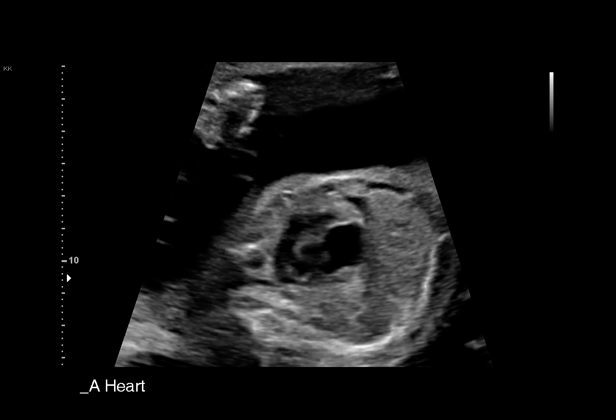
[im 81/104]
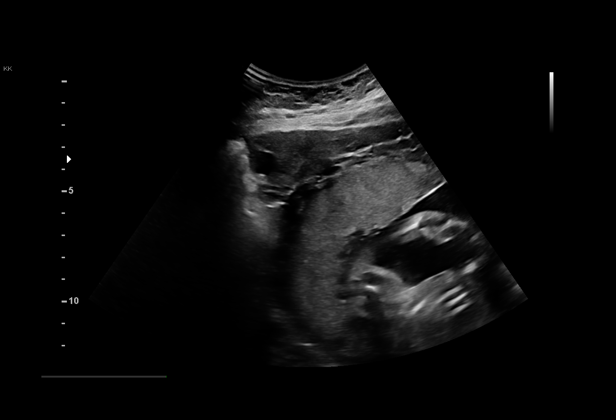
[im 88/104]
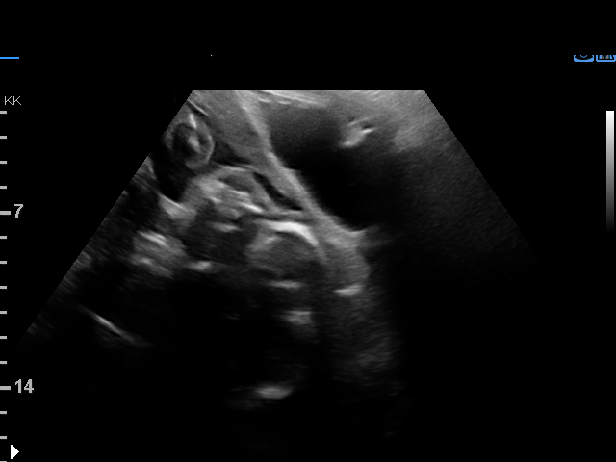
[im 96/104]
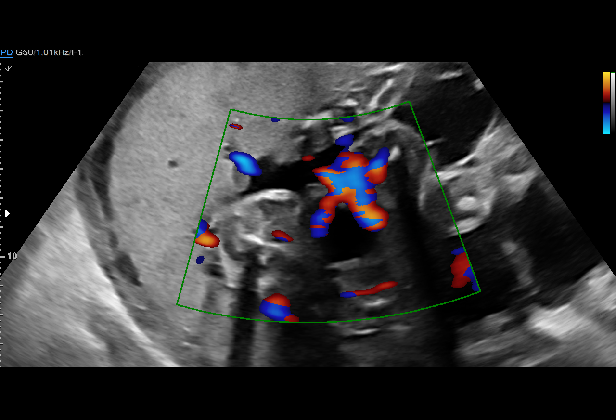
[im 104/104]
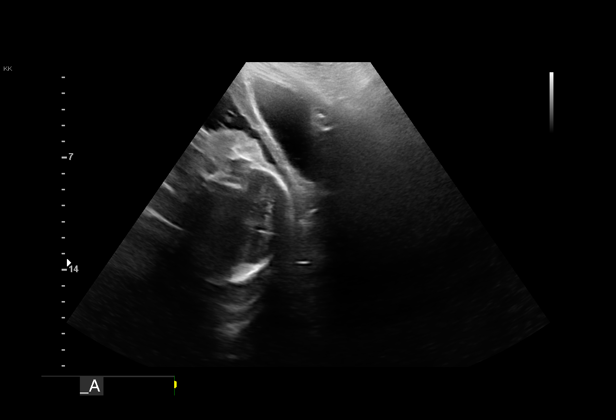

[15 of 28 positions shown; findings below may reference images not displayed]

WK

1  SHRADDHA LIPE             [id number]      [id number]     [id number]
ZACCHAEUS
2  SHRADDHA LIPE             [id number]      [id number]     [id number]
ZACCHAEUS
Indications

26 weeks gestation of pregnancy
Twin pregnancy, di/di, second trimester
Hypertension - Chronic/Pre-existing
Advanced maternal age primigravida 35+,
second trimester
History of sickle cell trait
Obesity complicating pregnancy, second
trimester (pre pregnancy BMI 36)
Cervical cerclage suture present, second
trimester (placed 01/25/18, [DATE]cm @ 96wks)
OB History

Blood Type:            Height:  5'2"   Weight (lb):  200       BMI:
Gravidity:    1
Fetal Evaluation (Fetus A)

Num Of Fetuses:     2
Fetal Heart         137
Rate(bpm):
Cardiac Activity:   Observed
Fetal Lie:          Lower Fetus
Presentation:       Cephalic
Placenta:           Posterior
P. Cord Insertion:  Not well visualized
Membrane Desc:      Dividing Membrane seen - Dichorionic.
Amniotic Fluid
AFI FV:      Subjectively within normal limits

Largest Pocket(cm)
4.6
Biometry (Fetus A)

BPD:        63  mm     G. Age:  25w 4d         11  %    CI:         70.6   %    70 - 86
FL/HC:      20.1   %    18.6 -
HC:       239   mm     G. Age:  26w 0d         10  %    HC/AC:      1.04        1.05 -
AC:      228.8  mm     G. Age:  27w 2d         62  %    FL/BPD:     76.2   %    71 - 87
FL:         48  mm     G. Age:  26w 1d         22  %    FL/AC:      21.0   %    20 - 24
Est. FW:     959  gm      2 lb 2 oz     54  %     FW Discordancy      0 \ 1 %
Gestational Age (Fetus A)

LMP:           26w 4d        Date:  09/20/17                 EDD:   06/27/18
U/S Today:     26w 2d                                        EDD:   06/29/18
Best:          26w 4d     Det. By:  LMP  (09/20/17)          EDD:   06/27/18
Anatomy (Fetus A)

Cranium:               Appears normal         Aortic Arch:            Not well visualized
Cavum:                 Not well visualized    Ductal Arch:            Not well visualized
Ventricles:            Not well visualized    Diaphragm:              Not well visualized
Choroid Plexus:        Not well visualized    Stomach:                Appears normal, left
sided
Cerebellum:            Not well visualized    Abdomen:                Appears normal
Posterior Fossa:       Not well visualized    Abdominal Wall:         Not well visualized
Nuchal Fold:           Not applicable (>20    Cord Vessels:           Appears normal (3
wks GA)                                        vessel cord)
Face:                  Appears normal         Kidneys:                Appear normal
(orbits and profile)
Lips:                  Not well visualized    Bladder:                Appears normal
Thoracic:              Appears normal         Spine:                  Not well visualized
Heart:                 Appears normal         Upper Extremities:      Not well visualized
(4CH, axis, and situs
RVOT:                  Appears normal         Lower Extremities:      Not well visualized
LVOT:                  Appears normal

Other:  Complete fetal anatomic survey previously performed in office.
Technically difficult due to fetal position.

Fetal Evaluation (Fetus B)

Num Of Fetuses:     2
Fetal Heart         164
Rate(bpm):
Cardiac Activity:   Observed
Fetal Lie:          Upper Fetus
Presentation:       Breech
Placenta:           Anterior
P. Cord Insertion:  Not well visualized
Membrane Desc:      Dividing Membrane seen - Dichorionic.

Amniotic Fluid
AFI FV:      Subjectively within normal limits
Largest Pocket(cm)
4.3
Biometry (Fetus B)

BPD:      65.8  mm     G. Age:  26w 4d         38  %    CI:        73.01   %    70 - 86
FL/HC:      20.0   %    18.6 -
HC:      244.8  mm     G. Age:  26w 4d         24  %    HC/AC:      1.10        1.05 -
AC:       222   mm     G. Age:  26w 4d         44  %    FL/BPD:     74.3   %    71 - 87
FL:       48.9  mm     G. Age:  26w 3d         32  %    FL/AC:      22.0   %    20 - 24

Est. FW:     952  gm      2 lb 2 oz     53  %     FW Discordancy         1  %
Gestational Age (Fetus B)

LMP:           26w 4d        Date:  09/20/17                 EDD:   06/27/18
U/S Today:     26w 4d                                        EDD:   06/27/18
Best:          26w 4d     Det. By:  LMP  (09/20/17)          EDD:   06/27/18
Anatomy (Fetus B)

Cranium:               Appears normal         Aortic Arch:            Not well visualized
Cavum:                 Appears normal         Ductal Arch:            Not well visualized
Ventricles:            Appears normal         Diaphragm:              Not well visualized
Choroid Plexus:        Appears normal         Stomach:                Appears normal, left
sided
Cerebellum:            Appears normal         Abdomen:                Appears normal
Posterior Fossa:       Appears normal         Abdominal Wall:         Not well visualized
Nuchal Fold:           Not applicable (>20    Cord Vessels:           Appears normal (3
wks GA)                                        vessel cord)
Face:                  Not well visualized    Kidneys:                Appear normal
Lips:                  Appears normal         Bladder:                Appears normal
Thoracic:              Appears normal         Spine:                  Appears normal
Heart:                 Not well visualized    Upper Extremities:      Not well visualized
RVOT:                  Not well visualized    Lower Extremities:      Not well visualized
LVOT:                  Appears normal

Other:  Complete fetal anatomic survey previously performed in office.
Technically difficult due to fetal position.
Cervix Uterus Adnexa

Cervix
Cerclage visualized.

Left Ovary
Not visualized.

Right Ovary
Within normal limits.

Adnexa:       No abnormality visualized.
Impression

Dichorionic-diamniotic twin pregnancy. Patient is admitted
with the diagnosis of preterm labor. PPROM was entertained
as a possibility at admission.
Patient had rescue cerclage in the second trimester.
She is not on antibiotics (PPROM as a diagnosis is not being
considered). She had NICU consultation. Patient had
received a course of Chepito.

Twin A: Inferior, cephalic, posterior placenta. Fetal growth is
appropriate for the gestational age. Amniotic fluid is normal
(MVP=5 cm) and good fetal activity is seen. Fetal head is
very low to obtain accurate measurements. Anatomical
survey is very limited.

Twin B: Superior, breech, anterior placenta. Fetal growth is
appropriate for the gestational age. Amniotic fluid is normal
(MVP=4 cm) and good fetal activity is seen. Fetal head is
very low to obtain accurate measurements. Anatomical
survey appears normal, but limited.

Growth discordancy: 1% (normal).

On transabdominal scan, the cervix could not be assessed. I
am reluctant to repeat transvaginal ultrasound because of the
possibility of PPROM and that it may not add information to
clinical evaluation.
Recommendations

-Consider continuation of inpatient management at least till
28 weeks.
-Cerclage removal should be considered if uterine
contractions become frequent and cervix is beginning to
dilate.
-Procardia may be continued for symptomatic relief, but is not
associated with improved neonatal outcome.
-[facility]ly ultrasound at [facility].
-Magnesium sulfate for neuroprotection if delivery is imminent.
-Rescue Chepito to be considered if patient goes into
labor 2 weeks after the previous course (and not after 33
weeks).

## 2019-08-15 ENCOUNTER — Encounter (HOSPITAL_BASED_OUTPATIENT_CLINIC_OR_DEPARTMENT_OTHER): Payer: Self-pay

## 2019-08-15 ENCOUNTER — Ambulatory Visit (HOSPITAL_BASED_OUTPATIENT_CLINIC_OR_DEPARTMENT_OTHER): Admit: 2019-08-15 | Payer: BC Managed Care – PPO | Admitting: Obstetrics and Gynecology

## 2019-08-15 SURGERY — DILATATION AND CURETTAGE/HYSTEROSCOPY WITH MINERVA
Anesthesia: General

## 2022-09-24 ENCOUNTER — Encounter (HOSPITAL_COMMUNITY): Payer: Self-pay | Admitting: Emergency Medicine

## 2022-09-24 ENCOUNTER — Emergency Department (HOSPITAL_COMMUNITY)
Admission: EM | Admit: 2022-09-24 | Discharge: 2022-09-24 | Disposition: A | Discharge status: Home / Self Care | Payer: BC Managed Care – PPO | Attending: Emergency Medicine | Admitting: Emergency Medicine

## 2022-09-24 DIAGNOSIS — T50901A Poisoning by unspecified drugs, medicaments and biological substances, accidental (unintentional), initial encounter: Secondary | ICD-10-CM

## 2022-09-24 DIAGNOSIS — T40711A Poisoning by cannabis, accidental (unintentional), initial encounter: Secondary | ICD-10-CM | POA: Diagnosis present

## 2022-09-24 LAB — CBC WITH DIFFERENTIAL/PLATELET
Abs Immature Granulocytes: 0.05 10*3/uL (ref 0.00–0.07)
Basophils Absolute: 0 10*3/uL (ref 0.0–0.1)
Basophils Relative: 0 %
Eosinophils Absolute: 0 10*3/uL (ref 0.0–0.5)
Eosinophils Relative: 1 %
HCT: 39 % (ref 36.0–46.0)
Hemoglobin: 12.4 g/dL (ref 12.0–15.0)
Immature Granulocytes: 1 %
Lymphocytes Relative: 22 %
Lymphs Abs: 1.7 10*3/uL (ref 0.7–4.0)
MCH: 23.2 pg — ABNORMAL LOW (ref 26.0–34.0)
MCHC: 31.8 g/dL (ref 30.0–36.0)
MCV: 72.9 fL — ABNORMAL LOW (ref 80.0–100.0)
Monocytes Absolute: 0.6 10*3/uL (ref 0.1–1.0)
Monocytes Relative: 7 %
Neutro Abs: 5.5 10*3/uL (ref 1.7–7.7)
Neutrophils Relative %: 69 %
Platelets: 289 10*3/uL (ref 150–400)
RBC: 5.35 MIL/uL — ABNORMAL HIGH (ref 3.87–5.11)
RDW: 17.6 % — ABNORMAL HIGH (ref 11.5–15.5)
WBC: 7.9 10*3/uL (ref 4.0–10.5)
nRBC: 0 % (ref 0.0–0.2)

## 2022-09-24 LAB — RAPID URINE DRUG SCREEN, HOSP PERFORMED
Amphetamines: NOT DETECTED
Barbiturates: NOT DETECTED
Benzodiazepines: NOT DETECTED
Cocaine: NOT DETECTED
Opiates: NOT DETECTED
Tetrahydrocannabinol: POSITIVE — AB

## 2022-09-24 LAB — BASIC METABOLIC PANEL
Anion gap: 10 (ref 5–15)
BUN: 10 mg/dL (ref 6–20)
CO2: 20 mmol/L — ABNORMAL LOW (ref 22–32)
Calcium: 8.5 mg/dL — ABNORMAL LOW (ref 8.9–10.3)
Chloride: 104 mmol/L (ref 98–111)
Creatinine, Ser: 0.75 mg/dL (ref 0.44–1.00)
GFR, Estimated: >60 mL/min (ref >60)
Glucose, Bld: 171 mg/dL — ABNORMAL HIGH (ref 70–99)
Potassium: 4.1 mmol/L (ref 3.5–5.1)
Sodium: 134 mmol/L — ABNORMAL LOW (ref 135–145)

## 2022-09-24 LAB — HCG, SERUM, QUALITATIVE: Preg, Serum: NEGATIVE

## 2022-09-24 LAB — ETHANOL: Alcohol, Ethyl (B): <10 mg/dL (ref <10)

## 2022-09-24 MED ORDER — SODIUM CHLORIDE 0.9 % IV BOLUS
1000.0000 mL | Freq: Once | INTRAVENOUS | Status: AC
  Administered 2022-09-24: 1000 mL via INTRAVENOUS

## 2022-09-24 MED ORDER — ONDANSETRON HCL 4 MG/2ML IJ SOLN
4.0000 mg | Freq: Once | INTRAMUSCULAR | Status: AC
  Administered 2022-09-24: 4 mg via INTRAVENOUS
  Filled 2022-09-24: qty 2

## 2022-09-24 NOTE — ED Notes (Signed)
Patient will open eyes to voice.  Unable to voice answers to questions at this time.  Vital signs stable.

## 2022-09-24 NOTE — ED Provider Notes (Signed)
  Physical Exam  BP 119/75 (BP Location: Right Arm)   Pulse 66   Temp (!) 97.5 F (36.4 C) (Oral)   Resp 14   LMP  (LMP Unknown)   SpO2 96%   Physical Exam Vitals and nursing note reviewed.  Constitutional:      General: She is not in acute distress.    Appearance: She is well-developed.  HENT:     Head: Normocephalic and atraumatic.  Eyes:     Conjunctiva/sclera: Conjunctivae normal.  Cardiovascular:     Rate and Rhythm: Normal rate and regular rhythm.     Heart sounds: No murmur heard. Pulmonary:     Effort: Pulmonary effort is normal. No respiratory distress.     Breath sounds: Normal breath sounds.  Abdominal:     Palpations: Abdomen is soft.     Tenderness: There is no abdominal tenderness.  Musculoskeletal:        General: No swelling.     Cervical back: Neck supple.  Skin:    General: Skin is warm and dry.     Capillary Refill: Capillary refill takes less than 2 seconds.  Neurological:     Mental Status: She is alert.  Psychiatric:        Mood and Affect: Mood normal.     Procedures  Procedures  ED Course / MDM   Clinical Course as of 09/24/22 1128  Sun Sep 24, 2022  0400 Pt re-assessed, remains intoxicated, needs more time to metabolize. No SI, calm/cooperative  [SG]  [id number] Re-assessed, needs further metabolization  [SG]  0703 MTF [MK]    Clinical Course User Index [MK] Shekita Boyden, Debe Coder, MD [SG] Jeanell Sparrow, DO   Medical Decision Making Amount and/or Complexity of Data Reviewed Labs: ordered.  Risk Prescription drug management.   Received an handoff.  Likely THC delta 8 overdose after eating multiple edibles.  Plan for signout was metabolization of underlying substances and reevaluation.  I reevaluated the patient and she has returned to normal mental status baseline.  She is able to tolerate p.o. without difficulty and she was discharged with instructions to refrain from using THC substances.       Teressa Lower, MD 09/24/22 [phone]

## 2022-09-24 NOTE — ED Triage Notes (Signed)
Patient here from unknown residence reporting drowsiness and confusion. Outside naked from waist down when EMS arrived. Reports possible ingestion of entire pack of Delta 8 gummies "high potency" 150mg  each with instructions to take 1/4th daily. Responds to "yes and no" questions.

## 2022-09-24 NOTE — ED Notes (Signed)
Patient able to complete PO challenge without difficulty she was able to verbally answer and interact with RN.

## 2022-09-24 NOTE — ED Provider Notes (Signed)
[facility] DEPT Provider Note  CSN: [id number] Arrival date & time: 09/24/22 0010  Chief Complaint(s) Ingestion  HPI Katelyn Snow is a 42 y.o. female with past medical history as below, significant for no significant med history who presents to the ED with complaint of THC ingestion.  Patient arrived from residence with intoxication, she was walking around outside in the parking lot naked on EMS arrival.  Reports she took an entire pack of delta 8 Gummies.  She denies any coingestions or reports nausea.  Denies any sort of injuries.  Denies suicidal intent or ideation.  Level 5 caveat, AMS  Past Medical History History reviewed. No pertinent past medical history. There are no problems to display for this patient.  Home Medication(s) Prior to Admission medications   Not on File                                                                                                                                    Past Surgical History History reviewed. No pertinent surgical history. Family History History reviewed. No pertinent family history.  Social History Social History   Tobacco Use   Smoking status: Never   Smokeless tobacco: Never   Allergies Patient has no known allergies.  Review of Systems Review of Systems  Unable to perform ROS: Mental status change  Gastrointestinal:  Positive for nausea.    Physical Exam Vital Signs  I have reviewed the triage vital signs BP 117/66   Pulse 72   Temp 97.6 F (36.4 C) (Oral)   Resp 17   LMP  (LMP Unknown)   SpO2 97%  Physical Exam Vitals and nursing note reviewed.  Constitutional:      General: She is not in acute distress.    Appearance: Normal appearance.     Comments: Sleepy but responsive to questions with provocation/noxious stimulus  HENT:     Head: Normocephalic and atraumatic. No raccoon eyes, Battle's sign, right periorbital erythema or left periorbital erythema.     Jaw: There is  normal jaw occlusion.     Right Ear: External ear normal.     Left Ear: External ear normal.     Nose: Nose normal.     Mouth/Throat:     Mouth: Mucous membranes are dry.  Eyes:     General: Vision grossly intact. Gaze aligned appropriately. No scleral icterus.       Right eye: No discharge.        Left eye: No discharge.     Extraocular Movements: Extraocular movements intact.     Pupils: Pupils are equal, round, and reactive to light.     Comments: Pupils 4 mm reactive bilateral  Cardiovascular:     Rate and Rhythm: Normal rate and regular rhythm.     Pulses: Normal pulses.     Heart sounds: Normal heart sounds.  Pulmonary:     Effort: Pulmonary effort is  normal. No respiratory distress.     Breath sounds: Normal breath sounds.  Abdominal:     General: Abdomen is flat.     Palpations: Abdomen is soft.     Tenderness: There is no abdominal tenderness.  Musculoskeletal:        General: Normal range of motion.     Cervical back: Normal range of motion. No rigidity.     Right lower leg: No edema.     Left lower leg: No edema.  Skin:    General: Skin is warm and dry.     Capillary Refill: Capillary refill takes less than 2 seconds.  Neurological:     Comments: Sleepy but responsive  Psychiatric:        Behavior: Behavior is cooperative.        Thought Content: Thought content does not include suicidal ideation. Thought content does not include suicidal plan.     ED Results and Treatments Labs (all labs ordered are listed, but only abnormal results are displayed) Labs Reviewed  CBC WITH DIFFERENTIAL/PLATELET - Abnormal; Notable for the following components:      Result Value   RBC 5.35 (*)    MCV 72.9 (*)    MCH 23.2 (*)    RDW 17.6 (*)    All other components within normal limits  BASIC METABOLIC PANEL - Abnormal; Notable for the following components:   Sodium 134 (*)    CO2 20 (*)    Glucose, Bld 171 (*)    Calcium 8.5 (*)    All other components within normal  limits  RAPID URINE DRUG SCREEN, HOSP PERFORMED - Abnormal; Notable for the following components:   Tetrahydrocannabinol POSITIVE (*)    All other components within normal limits  ETHANOL  HCG, SERUM, QUALITATIVE                                                                                                                          Radiology No results found.  Pertinent labs & imaging results that were available during my care of the patient were reviewed by me and considered in my medical decision making (see MDM for details).  Medications Ordered in ED Medications  sodium chloride 0.9 % bolus 1,000 mL (0 mLs Intravenous Stopped 09/24/22 0232)  ondansetron (ZOFRAN) injection 4 mg (4 mg Intravenous Given 09/24/22 0039)  Procedures Procedures  (including critical care time)  Medical Decision Making / ED Course   MDM:  Katelyn Snow is a 42 y.o. female with past medical history as below, significant for no significant med history who presents to the ED with complaint of THC ingestion.. The complaint involves an extensive differential diagnosis and also carries with it a high risk of complications and morbidity.  Serious etiology was considered. Ddx includes but is not limited to: Differential diagnoses for altered mental status includes but is not exclusive to alcohol, illicit or prescription medications, intracranial pathology such as stroke, intracerebral hemorrhage, fever or infectious causes including sepsis, hypoxemia, uremia, trauma, endocrine related disorders such as diabetes, hypoglycemia, thyroid-related diseases, etc.   On initial assessment the patient is: Resting comfortably on ambient air.  HDS.  Exam is reassuring.  She does appear intoxicated  Vital signs and nursing notes were reviewed  Clinical Course as of 09/24/22 0816  Wynelle Link Sep 24, 2022   0400 Pt re-assessed, remains intoxicated, needs more time to metabolize. No SI, calm/cooperative  [SG]  [id number] Re-assessed, needs further metabolization  [SG]  0703 MTF [MK]    Clinical Course User Index [MK] Kommor, [place], MD [SG] Sloan Leiter, DO   Labs reviewed, [facility] +tive, o/w stable  Give IVF and Zofran; nausea has resolved, no emesis or abd pain in ED on exam  Pt with THC ingestion, not suicide attempt. Pt intoxicated and needs further time to metabolize, her partner is a patient in the ED as well and has similar presentation.   She is HDS, pending further metabolization for dispo   Signed out to incoming EDP pending above  Additional history obtained: -Additional history obtained from ems -External records from outside source obtained and reviewed including: Chart review including previous notes, labs, imaging, consultation notes including PDMP, no other records available    Lab Tests: -I ordered, reviewed, and interpreted labs.   The pertinent results include:   Labs Reviewed  CBC WITH DIFFERENTIAL/PLATELET - Abnormal; Notable for the following components:      Result Value   RBC 5.35 (*)    MCV 72.9 (*)    MCH 23.2 (*)    RDW 17.6 (*)    All other components within normal limits  BASIC METABOLIC PANEL - Abnormal; Notable for the following components:   Sodium 134 (*)    CO2 20 (*)    Glucose, Bld 171 (*)    Calcium 8.5 (*)    All other components within normal limits  RAPID URINE DRUG SCREEN, HOSP PERFORMED - Abnormal; Notable for the following components:   Tetrahydrocannabinol POSITIVE (*)    All other components within normal limits  ETHANOL  HCG, SERUM, QUALITATIVE    Notable for thc +  EKG   EKG Interpretation  Date/Time:  Sunday September 24 2022 00:15:01 EST Ventricular Rate:  91 PR Interval:  209 QRS Duration: 89 QT Interval:  355 QTC Calculation: 437 R Axis:   20 Text Interpretation: Sinus rhythm Prolonged PR interval Borderline T wave  abnormalities No old tracing to compare Confirmed by Tanda Rockers ([id number]) on 09/24/2022 12:39:20 AM         Imaging Studies ordered: I ordered imaging studies including na   Medicines ordered and prescription drug management: Meds ordered this encounter  Medications   sodium chloride 0.9 % bolus 1,000 mL   ondansetron (ZOFRAN) injection 4 mg    -I have reviewed the patients home medicines and have made adjustments as needed  Consultations Obtained: na   Cardiac Monitoring: The patient was maintained on a cardiac monitor.  I personally viewed and interpreted the cardiac monitored which showed an underlying rhythm of: nsr  Social Determinants of Health:  Diagnosis or treatment significantly limited by social determinants of health: alcohol use and polysubstance abuse   Reevaluation: After the interventions noted above, I reevaluated the patient and found that they have improved  Co morbidities that complicate the patient evaluation History reviewed. No pertinent past medical history.    Dispostion: Disposition decision including need for hospitalization was considered, and patient dispo pending    Final Clinical Impression(s) / ED Diagnoses Final diagnoses:  Accidental overdose, initial encounter     This chart was dictated using voice recognition software.  Despite best efforts to proofread,  errors can occur which can change the documentation meaning.    Sloan Leiter, DO 09/24/22 [phone]

## 2022-09-24 NOTE — Discharge Instructions (Addendum)
It was a pleasure caring for you today in the emergency department.  Please return to the emergency department for any worsening or worrisome symptoms.  Please stop using THC products

## 2022-09-24 NOTE — ED Notes (Signed)
Patient resting quietly on bed in room at this time.  Respirations even and unlabored

## 2022-09-25 ENCOUNTER — Encounter (HOSPITAL_COMMUNITY): Payer: Self-pay | Admitting: Emergency Medicine

## 2023-09-07 ENCOUNTER — Other Ambulatory Visit: Payer: Self-pay

## 2023-09-07 DIAGNOSIS — E282 Polycystic ovarian syndrome: Secondary | ICD-10-CM

## 2023-09-10 ENCOUNTER — Other Ambulatory Visit: Payer: Medicaid Other

## 2023-09-13 ENCOUNTER — Ambulatory Visit: Payer: Medicaid Other | Admitting: "Endocrinology

## 2023-09-13 ENCOUNTER — Encounter: Payer: Self-pay | Admitting: "Endocrinology

## 2023-09-13 VITALS — BP 126/78 | HR 74 | Ht 62.0 in | Wt 216.0 lb

## 2023-09-13 DIAGNOSIS — E7800 Pure hypercholesterolemia, unspecified: Secondary | ICD-10-CM

## 2023-09-13 DIAGNOSIS — R7303 Prediabetes: Secondary | ICD-10-CM | POA: Diagnosis not present

## 2023-09-13 DIAGNOSIS — Z6839 Body mass index (BMI) 39.0-39.9, adult: Secondary | ICD-10-CM | POA: Diagnosis not present

## 2023-09-13 DIAGNOSIS — E010 Iodine-deficiency related diffuse (endemic) goiter: Secondary | ICD-10-CM

## 2023-09-13 DIAGNOSIS — E66812 Obesity, class 2: Secondary | ICD-10-CM

## 2023-09-13 MED ORDER — METFORMIN HCL ER 500 MG PO TB24: 1000.0000 mg | ORAL_TABLET | Freq: Two times a day (BID) | ORAL | 3 refills | Status: DC

## 2023-09-13 NOTE — Progress Notes (Signed)
 Outpatient Endocrinology Note Katelyn Birmingham, MD    Katelyn Snow 1981/02/24 [id number]  Referring Provider: Danielle Rom, * Primary Care Provider: Patient, No Pcp Per Reason for consultation: Subjective   Assessment & Plan  Katelyn Snow was seen today for establish care.  Diagnoses and all orders for this visit:  Class 2 severe obesity due to excess calories with serious comorbidity and body mass index (BMI) of 39.0 to 39.9 in adult [facility])  Prediabetes  Pure hypercholesterolemia  Thyromegaly -     US  THYROID ; Future -     US  THYROID   Other orders -     metFORMIN  (GLUCOPHAGE -XR) 500 MG 24 hr tablet; Take 2 tablets (1,000 mg total) by mouth 2 (two) times daily with a meal.   Pt interested in weight loss Discussed lifestyle changes, medical management as well as bariatric surgery Maintain healthy lifestyle including 1200 Cal/day, 30 min of activity/day, avoiding refined/processed/outside food 20 minutes physical activity per day, in continuum or interruptedly through the day  Goals: less than 60 grams of carbohydrate/meal, 1200-1500 Cal/day, 10,0000 steps a day and weight loss of 0.5-1 lb/ wk  Sleep 7-9 hours/day, adapt good sleep hygiene Adapt de-stressing and relaxation techniques to prevent stress induced weight gain Avoid/switch medications that lead to weight gain by discussing with the prescribing physician   To prevent progression of pre-diabetes: We will start you on an extended release version of metformin , which should help with the upset stomach. Start with one 500 mg capsule taken every evening with dinner. Stay on this for 3-5 days, then increase to 1 tablet with breakfast and one with dinner. If you do not have any upset stomach, gradually increase to the goal dose of 2 capsules with breakfast and 2 capsules with dinner. If you do have upset stomach, decrease it back to the previous dose that you tolerated.  Week 1: one tablet after dinner Week 2:  one tablet after breakfast and one after dinner  Week 3: one tablet after breakfast and two after dinner  Week 4 and onwards: two tablets after breakfast and two after dinner  Thyromegaly noted on exam Ordered baseline thyroid  U/S Thyroid  labs WNL   Return in about 6 weeks (around 10/25/2023).   I have reviewed current medications, nurse's notes, allergies, vital signs, past medical and surgical history, family medical history, and social history for this encounter. Counseled patient on symptoms, examination findings, lab findings, imaging results, treatment decisions and monitoring and prognosis. The patient understood the recommendations and agrees with the treatment plan. All questions regarding treatment plan were fully answered.  Katelyn Birmingham, MD  09/13/23   History of Present Illness HPI  Katelyn Snow is a 43 y.o. year old female who presents for evaluation of PCOS.  PCOS was diagnosed in 35s by her ob-gyn based off of ovarian ultrasound and menstrual irregularities Needed fertility treatment   Was on metformin  but stopped due to GI issues Started having menstrual irregular since her teenage years   Reports being a bug child, runs in the dad's side of family Has been current weight since 2 years Has gradually gained weight over the coarse the years Did not like birth control due to weight gain    Physical Exam  BP 126/78 (BP Location: Left Arm, Patient Position: Sitting, Cuff Size: Large)   Pulse 74   Ht 5' 2 (1.575 m)   Wt 216 lb (98 kg)   SpO2 98%   BMI 39.51 kg/m    Constitutional:  well developed, well nourished Head: normocephalic, atraumatic Eyes: sclera anicteric, no redness Neck: supple Lungs: normal respiratory effort Neurology: alert and oriented Skin: dry, no appreciable rashes Musculoskeletal: no appreciable defects Psychiatric: normal mood and affect   Current Medications Patient's Medications  New Prescriptions   METFORMIN  (GLUCOPHAGE -XR)  500 MG 24 HR TABLET    Take 2 tablets (1,000 mg total) by mouth 2 (two) times daily with a meal.  Previous Medications   AMLODIPINE (NORVASC) 10 MG TABLET    Take 10 mg by mouth daily.   HYDROCHLOROTHIAZIDE (HYDRODIURIL) 25 MG TABLET    Take 25 mg by mouth daily.   IBUPROFEN  (ADVIL ,MOTRIN ) 600 MG TABLET    Take 1 tablet (600 mg total) by mouth every 6 (six) hours.   JUNEL FE 1/20 1-20 MG-MCG TABLET    Take 1 tablet by mouth every evening.    LABETALOL  (NORMODYNE ) 100 MG TABLET    Take 1 tablet (100 mg total) by mouth 2 (two) times daily.   MEDROXYPROGESTERONE (PROVERA) 10 MG TABLET    Take 10 mg by mouth daily.   PANTOPRAZOLE  (PROTONIX ) 40 MG TABLET    Take 1 tablet (40 mg total) by mouth daily.   ZOLPIDEM  (AMBIEN ) 5 MG TABLET    Take 1 tablet (5 mg total) by mouth at bedtime as needed for sleep.  Modified Medications   No medications on file  Discontinued Medications   No medications on file    Allergies Allergies  Allergen Reactions   Benzonatate Other (See Comments)    Other Reaction(s): not effective   Lisinopril Other (See Comments)    Other Reaction(s): not effective   Norgestim-Eth Estrad Triphasic     Other Reaction(s): breakthrough bleeding    Past Medical History Past Medical History:  Diagnosis Date   Blood transfusion without reported diagnosis    Headache(784.0)    migrains   Hypertension    Short cervix with cervical cerclage in second trimester, antepartum 01/25/2018    Past Surgical History Past Surgical History:  Procedure Laterality Date   CERVICAL CERCLAGE N/A 01/25/2018   Procedure: CERCLAGE CERVICAL;  Surgeon: Danielle Rom, MD;  Location: [facility];  Service: Gynecology;  Laterality: N/A;   CESAREAN SECTION N/A 03/30/2018   Procedure: CESAREAN SECTION;  Surgeon: Estelle Service, MD;  Location: [facility]IRTH[facility] SUIT[facility];  Service: Obstetrics;  Laterality: N/A;   NO PAST SURGERIES      Family History family history includes Diabetes in her maternal  grandmother; Hypertension in her father, maternal grandmother, and mother.  Social History Social History   Socioeconomic History   Marital status: Married    Spouse name: Not on file   Number of children: Not on file   Years of education: Not on file   Highest education level: Not on file  Occupational History   Not on file  Tobacco Use   Smoking status: Never   Smokeless tobacco: Never  Vaping Use   Vaping status: Never Used  Substance and Sexual Activity   Alcohol use: Not Currently    Alcohol/week: 1.0 standard drink of alcohol    Types: 1 Standard drinks or equivalent per week   Drug use: No   Sexual activity: Yes    Partners: Male  Other Topics Concern   Not on file  Social History Narrative   ** Merged History Encounter **       Social Drivers of Corporate Investment Banker Strain: Not on file  Food Insecurity: Not on file  Transportation Needs: Not on file  Physical Activity: Not on file  Stress: Not on file  Social Connections: Not on file  Intimate Partner Violence: Not on file    Lab Results  Component Value Date   CHOL 214 (H) 09/10/2023   Lab Results  Component Value Date   HDL 43 (L) 09/10/2023   Lab Results  Component Value Date   LDLCALC 151 (H) 09/10/2023   Lab Results  Component Value Date   TRIG 96 09/10/2023   Lab Results  Component Value Date   CHOLHDL 5.0 (H) 09/10/2023   Lab Results  Component Value Date   CREATININE 0.78 09/10/2023   No results found for: GFR    Component Value Date/Time   NA 139 09/10/2023 0816   K 4.0 09/10/2023 0816   CL 104 09/10/2023 0816   CO2 27 09/10/2023 0816   GLUCOSE 93 09/10/2023 0816   BUN 12 09/10/2023 0816   CREATININE 0.78 09/10/2023 0816   CALCIUM  9.5 09/10/2023 0816   PROT 7.7 09/10/2023 0816   ALBUMIN 2.5 (L) 03/31/2018 0647   AST 14 09/10/2023 0816   ALT 12 09/10/2023 0816   ALKPHOS 70 03/31/2018 0647   BILITOT 0.3 09/10/2023 0816   GFRNONAA >60 09/24/2022 0032   GFRAA  >60 04/07/2019 0450      Latest Ref Rng & Units 09/10/2023    8:16 AM 09/24/2022   12:32 AM 04/07/2019    4:50 AM  BMP  Glucose 65 - 99 mg/dL 93  828  874   BUN 7 - 25 mg/dL 12  10  8    Creatinine 0.50 - 0.99 mg/dL 9.21  9.24  9.16   BUN/Creat Ratio 6 - 22 (calc) SEE NOTE:     Sodium 135 - 146 mmol/L 139  134  138   Potassium 3.5 - 5.3 mmol/L 4.0  4.1  3.5   Chloride 98 - 110 mmol/L 104  104  108   CO2 20 - 32 mmol/L 27  20  22    Calcium  8.6 - 10.2 mg/dL 9.5  8.5  9.3        Component Value Date/Time   WBC 7.9 09/24/2022 0032   RBC 5.35 (H) 09/24/2022 0032   HGB 12.4 09/24/2022 0032   HCT 39.0 09/24/2022 0032   PLT 289 09/24/2022 0032   MCV 72.9 (L) 09/24/2022 0032   MCV 71.0 (A) 10/10/2017 1003   MCH 23.2 (L) 09/24/2022 0032   MCHC 31.8 09/24/2022 0032   RDW 17.6 (H) 09/24/2022 0032   LYMPHSABS 1.7 09/24/2022 0032   MONOABS 0.6 09/24/2022 0032   EOSABS 0.0 09/24/2022 0032   BASOSABS 0.0 09/24/2022 0032   Lab Results  Component Value Date   TSH 2.78 09/10/2023   FREET4 0.9 09/10/2023         Parts of this note may have been dictated using voice recognition software. There may be variances in spelling and vocabulary which are unintentional. Not all errors are proofread. Please notify the dino if any discrepancies are noted or if the meaning of any statement is not clear.

## 2023-09-13 NOTE — Patient Instructions (Signed)
We will start you on an extended release version of metformin, which should help with the upset stomach. Start with one 500 mg capsule taken every evening with dinner. Stay on this for 3-5 days, then increase to 1 tablet with breakfast and one with dinner. If you do not have any upset stomach, gradually increase to the goal dose of 2 capsules with breakfast and 2 capsules with dinner. If you do have upset stomach, decrease it back to the previous dose that you tolerated.  Week 1: one tablet after dinner Week 2: one tablet after breakfast and one after dinner  Week 3: one tablet after breakfast and two after dinner  Week 4 and onwards: two tablets after breakfast and two after dinner

## 2023-09-14 LAB — COMPREHENSIVE METABOLIC PANEL
AG Ratio: 1.4 (calc) (ref 1.0–2.5)
ALT: 12 U/L (ref 6–29)
AST: 14 U/L (ref 10–30)
Albumin: 4.5 g/dL (ref 3.6–5.1)
Alkaline phosphatase (APISO): 64 U/L (ref 31–125)
BUN: 12 mg/dL (ref 7–25)
CO2: 27 mmol/L (ref 20–32)
Calcium: 9.5 mg/dL (ref 8.6–10.2)
Chloride: 104 mmol/L (ref 98–110)
Creat: 0.78 mg/dL (ref 0.50–0.99)
Globulin: 3.2 g/dL (ref 1.9–3.7)
Glucose, Bld: 93 mg/dL (ref 65–99)
Potassium: 4 mmol/L (ref 3.5–5.3)
Sodium: 139 mmol/L (ref 135–146)
Total Bilirubin: 0.3 mg/dL (ref 0.2–1.2)
Total Protein: 7.7 g/dL (ref 6.1–8.1)

## 2023-09-14 LAB — TESTOSTERONE, FREE & TOTAL
Free Testosterone: 2.1 pg/mL (ref 0.1–6.4)
Testosterone, Total, LC-MS-MS: 18 ng/dL (ref 2–45)

## 2023-09-14 LAB — HEMOGLOBIN A1C
Hgb A1c MFr Bld: 6.1 %{Hb} — ABNORMAL HIGH (ref <5.7)
Mean Plasma Glucose: 128 mg/dL
eAG (mmol/L): 7.1 mmol/L

## 2023-09-14 LAB — LIPID PANEL
Cholesterol: 214 mg/dL — ABNORMAL HIGH (ref <200)
HDL: 43 mg/dL — ABNORMAL LOW (ref >=50)
LDL Cholesterol (Calc): 151 mg/dL — ABNORMAL HIGH
Non-HDL Cholesterol (Calc): 171 mg/dL — ABNORMAL HIGH (ref <130)
Total CHOL/HDL Ratio: 5 (calc) — ABNORMAL HIGH (ref <5.0)
Triglycerides: 96 mg/dL (ref <150)

## 2023-09-14 LAB — TSH: TSH: 2.78 m[IU]/L

## 2023-09-14 LAB — CORTISOL: Cortisol, Plasma: 14.9 ug/dL

## 2023-09-14 LAB — INSULIN, RANDOM: Insulin: 18.5 u[IU]/mL — ABNORMAL HIGH

## 2023-09-14 LAB — DHEA-SULFATE: DHEA-SO4: 72 ug/dL (ref 15–205)

## 2023-09-14 LAB — T4, FREE: Free T4: 0.9 ng/dL (ref 0.8–1.8)

## 2023-09-24 ENCOUNTER — Ambulatory Visit (HOSPITAL_BASED_OUTPATIENT_CLINIC_OR_DEPARTMENT_OTHER)
Admission: RE | Admit: 2023-09-24 | Discharge: 2023-09-24 | Disposition: A | Discharge status: Home / Self Care | Payer: Medicaid Other | Source: Ambulatory Visit | Attending: "Endocrinology | Admitting: "Endocrinology

## 2023-09-24 DIAGNOSIS — E010 Iodine-deficiency related diffuse (endemic) goiter: Secondary | ICD-10-CM | POA: Diagnosis present

## 2023-10-25 ENCOUNTER — Ambulatory Visit: Payer: Medicaid Other | Admitting: "Endocrinology

## 2023-12-10 ENCOUNTER — Ambulatory Visit: Payer: Medicaid Other | Admitting: "Endocrinology

## 2024-02-11 ENCOUNTER — Ambulatory Visit: Admitting: "Endocrinology

## 2024-03-18 ENCOUNTER — Encounter (HOSPITAL_COMMUNITY): Payer: Self-pay

## 2024-03-18 ENCOUNTER — Ambulatory Visit (HOSPITAL_COMMUNITY)
Admission: RE | Admit: 2024-03-18 | Discharge: 2024-03-18 | Disposition: A | Discharge status: Home / Self Care | Source: Ambulatory Visit | Attending: Emergency Medicine | Admitting: Emergency Medicine

## 2024-03-18 VITALS — BP 140/90 | HR 64 | Temp 98.9°F | Resp 18

## 2024-03-18 DIAGNOSIS — M5431 Sciatica, right side: Secondary | ICD-10-CM

## 2024-03-18 MED ORDER — KETOROLAC TROMETHAMINE 30 MG/ML IJ SOLN
30.0000 mg | Freq: Once | INTRAMUSCULAR | Status: AC
  Administered 2024-03-18: 30 mg via INTRAMUSCULAR

## 2024-03-18 MED ORDER — CYCLOBENZAPRINE HCL 10 MG PO TABS: 10.0000 mg | ORAL_TABLET | Freq: Two times a day (BID) | ORAL | 0 refills | Status: DC | PRN

## 2024-03-18 MED ORDER — PREDNISONE 20 MG PO TABS: 40.0000 mg | ORAL_TABLET | Freq: Every day | ORAL | 0 refills | Status: AC

## 2024-03-18 MED ORDER — KETOROLAC TROMETHAMINE 30 MG/ML IJ SOLN
INTRAMUSCULAR | Status: AC
  Filled 2024-03-18: qty 1

## 2024-03-18 NOTE — Discharge Instructions (Addendum)
 The Toradol  injection given today should start to work in about 30 minutes. Please do not use any NSAIDs (ibuprofen /Advil , naproxen/Aleve, etc) for the next 24 hours. You can safely use tylenol  or excedrin  You can take the muscle relaxer Flexeril  twice daily. If the medication makes you drowsy, take only at bed time.  Starting tomorrow morning, take the prednisone  as directed. 2 tablets daily for 5 days in a row

## 2024-03-18 NOTE — ED Triage Notes (Signed)
 Pt c/o sciatica pain to rt buttocks radiating down rt leg with numbness x1.5wks. Denies known injury but has moved recently. States taken aleve and Excedrin migraine with relief.

## 2024-03-18 NOTE — ED Provider Notes (Signed)
 [facility]    CSN: [id number] Arrival date & time: 03/18/24  9083      History   Chief Complaint Chief Complaint  Patient presents with   appt- Back pain    HPI Katelyn Snow is a 43 y.o. female.  1-2 week history of right glute pain that radiates into the posterior right thigh. She has numbness in the posterior thigh as well. Pain rated 8/10. Worse with sitting She reports recent moving with lifting and bending. Denies back pain or back injury No weakness in extremities Has been taking Excedrin migraine that helps  No history of sciatica   Past Medical History:  Diagnosis Date   Blood transfusion without reported diagnosis    Headache(784.0)    migrains   Hypertension    Short cervix with cervical cerclage in second trimester, antepartum 01/25/2018    Patient Active Problem List   Diagnosis Date Noted   Preterm labor 03/30/2018   S/P primary low transverse C-section 03/30/2018   Threatened preterm labor, second trimester 03/21/2018   Short cervix with cervical cerclage in second trimester, antepartum 01/25/2018   Short cervical length during pregnancy in second trimester 01/24/2018   Chronic hypertension during pregnancy, antepartum 11/14/2017   Subchorionic hematoma in first trimester 11/14/2017   New onset headache 04/10/2014    Past Surgical History:  Procedure Laterality Date   CERVICAL CERCLAGE N/A 01/25/2018   Procedure: CERCLAGE CERVICAL;  Surgeon: Danielle Rom, MD;  Location: [facility];  Service: Gynecology;  Laterality: N/A;   CESAREAN SECTION N/A 03/30/2018   Procedure: CESAREAN SECTION;  Surgeon: Estelle Service, MD;  Location: [facility];  Service: Obstetrics;  Laterality: N/A;   NO PAST SURGERIES      OB History     Gravida  1   Para  0   Term  0   Preterm  0   AB  0   Living         SAB  0   IAB  0   Ectopic  0   Multiple      Live Births               Home Medications    Prior to  Admission medications   Medication Sig Start Date End Date Taking? Authorizing Provider  cyclobenzaprine  (FLEXERIL ) 10 MG tablet Take 1 tablet (10 mg total) by mouth 2 (two) times daily as needed for muscle spasms. 03/18/24  Yes Robben Jagiello, [place], PA-C  predniSONE  (DELTASONE ) 20 MG tablet Take 2 tablets (40 mg total) by mouth daily with breakfast for 5 days. 03/19/24 03/24/24 Yes Tiziana Cislo, [place], PA-C  medroxyPROGESTERone (PROVERA) 10 MG tablet Take 10 mg by mouth daily. 06/27/23   [provider]  metFORMIN  (GLUCOPHAGE -XR) 500 MG 24 hr tablet Take 2 tablets (1,000 mg total) by mouth 2 (two) times daily with a meal. 09/13/23 10/13/23  Dartha Ernst, MD    Family History Family History  Problem Relation Age of Onset   Hypertension Mother    Hypertension Father    Hypertension Maternal Grandmother    Diabetes Maternal Grandmother     Social History Social History   Tobacco Use   Smoking status: Never   Smokeless tobacco: Never  Vaping Use   Vaping status: Never Used  Substance Use Topics   Alcohol use: Not Currently    Alcohol/week: 1.0 standard drink of alcohol    Types: 1 Standard drinks or equivalent per week   Drug use: No  Allergies   Benzonatate, Lisinopril, and Norgestim-eth estrad triphasic   Review of Systems Review of Systems  As per HPI  Physical Exam Triage Vital Signs ED Triage Vitals [03/18/24 0958]  Encounter Vitals Group     BP (!) 151/95     Girls Systolic BP Percentile      Girls Diastolic BP Percentile      Boys Systolic BP Percentile      Boys Diastolic BP Percentile      Pulse Rate 64     Resp 18     Temp 98.9 F (37.2 C)     Temp Source Oral     SpO2 98 %     Weight      Height      Head Circumference      Peak Flow      Pain Score 8     Pain Loc      Pain Education      Exclude from Growth Chart    No data found.  Updated Vital Signs BP (!) 140/90 (BP Location: Right Arm)   Pulse 64   Temp 98.9 F (37.2 C) (Oral)   Resp  18   LMP 03/14/2024 (Exact Date)   SpO2 98%   Breastfeeding No   Visual Acuity Right Eye Distance:   Left Eye Distance:   Bilateral Distance:    Right Eye Near:   Left Eye Near:    Bilateral Near:     Physical Exam Vitals and nursing note reviewed.  Constitutional:      General: She is not in acute distress. HENT:     Mouth/Throat:     Mouth: Mucous membranes are moist.     Pharynx: Oropharynx is clear.  Eyes:     Extraocular Movements: Extraocular movements intact.     Conjunctiva/sclera: Conjunctivae normal.     Pupils: Pupils are equal, round, and reactive to light.  Cardiovascular:     Rate and Rhythm: Normal rate and regular rhythm.     Heart sounds: Normal heart sounds.  Pulmonary:     Effort: Pulmonary effort is normal.     Breath sounds: Normal breath sounds.  Musculoskeletal:     Cervical back: Normal range of motion. No rigidity or tenderness.     Lumbar back: No tenderness or bony tenderness. Normal range of motion. Positive right straight leg raise test.     Comments: No bony tenderness C-L spine. No paraspinal muscular tenderness  Skin:    General: Skin is warm and dry.  Neurological:     General: No focal deficit present.     Mental Status: She is alert and oriented to person, place, and time.     Cranial Nerves: Cranial nerves 2-12 are intact. No cranial nerve deficit.     Sensory: Sensation is intact.     Motor: Motor function is intact. No weakness.     Coordination: Coordination is intact.     Gait: Gait is intact.     Deep Tendon Reflexes: Reflexes are normal and symmetric.     Comments: Strength 5/5. Sensation mildly diminished right posterior thigh, elsewhere intact     UC Treatments / Results  Labs (all labs ordered are listed, but only abnormal results are displayed) Labs Reviewed - No data to display  EKG   Radiology No results found.  Procedures Procedures (including critical care time)  Medications Ordered in UC Medications   ketorolac  (TORADOL ) 30 MG/ML injection 30 mg (30 mg Intramuscular Given  03/18/24 1028)    Initial Impression / Assessment and Plan / UC Course  I have reviewed the triage vital signs and the nursing notes.  Pertinent labs & imaging results that were available during my care of the patient were reviewed by me and considered in my medical decision making (see chart for details).  Right sciatica  IM toradol  given in clinic Starting tomorrow, prednisone  burst.  Also recommend Flexeril .  Advised other supportive care.  Further information provided in AVS.  Advised return precautions.  Patient agreeable to plan, no questions  Final Clinical Impressions(s) / UC Diagnoses   Final diagnoses:  Right sided sciatica     Discharge Instructions      The Toradol  injection given today should start to work in about 30 minutes. Please do not use any NSAIDs (ibuprofen /Advil , naproxen/Aleve, etc) for the next 24 hours. You can safely use tylenol  or excedrin  You can take the muscle relaxer Flexeril  twice daily. If the medication makes you drowsy, take only at bed time.  Starting tomorrow morning, take the prednisone  as directed. 2 tablets daily for 5 days in a row    ED Prescriptions     Medication Sig Dispense Auth. Provider   predniSONE  (DELTASONE ) 20 MG tablet Take 2 tablets (40 mg total) by mouth daily with breakfast for 5 days. 10 tablet Elody Kleinsasser, PA-C   cyclobenzaprine  (FLEXERIL ) 10 MG tablet Take 1 tablet (10 mg total) by mouth 2 (two) times daily as needed for muscle spasms. 20 tablet Bilan Tedesco, [place], PA-C      PDMP not reviewed this encounter.   Ranada Vigorito, [place], [place] 03/18/24 1057

## 2024-04-28 ENCOUNTER — Ambulatory Visit: Admitting: "Endocrinology

## 2024-05-29 ENCOUNTER — Emergency Department (HOSPITAL_COMMUNITY)
Admission: EM | Admit: 2024-05-29 | Discharge: 2024-05-29 | Disposition: A | Discharge status: Home / Self Care | Attending: Emergency Medicine | Admitting: Emergency Medicine

## 2024-05-29 ENCOUNTER — Encounter (HOSPITAL_COMMUNITY): Payer: Self-pay

## 2024-05-29 ENCOUNTER — Other Ambulatory Visit: Payer: Self-pay

## 2024-05-29 ENCOUNTER — Emergency Department (HOSPITAL_COMMUNITY)

## 2024-05-29 DIAGNOSIS — N939 Abnormal uterine and vaginal bleeding, unspecified: Secondary | ICD-10-CM | POA: Diagnosis present

## 2024-05-29 DIAGNOSIS — D509 Iron deficiency anemia, unspecified: Secondary | ICD-10-CM | POA: Diagnosis not present

## 2024-05-29 DIAGNOSIS — N83201 Unspecified ovarian cyst, right side: Secondary | ICD-10-CM | POA: Insufficient documentation

## 2024-05-29 LAB — BASIC METABOLIC PANEL WITH GFR
Anion gap: 12 (ref 5–15)
BUN: 7 mg/dL (ref 6–20)
CO2: 22 mmol/L (ref 22–32)
Calcium: 9.1 mg/dL (ref 8.9–10.3)
Chloride: 106 mmol/L (ref 98–111)
Creatinine, Ser: 0.82 mg/dL (ref 0.44–1.00)
GFR, Estimated: >60 mL/min (ref >60)
Glucose, Bld: 98 mg/dL (ref 70–99)
Potassium: 4.4 mmol/L (ref 3.5–5.1)
Sodium: 139 mmol/L (ref 135–145)

## 2024-05-29 LAB — CBC WITH DIFFERENTIAL/PLATELET
Abs Immature Granulocytes: 0.03 K/uL (ref 0.00–0.07)
Basophils Absolute: 0 K/uL (ref 0.0–0.1)
Basophils Relative: 0 %
Eosinophils Absolute: 0.1 K/uL (ref 0.0–0.5)
Eosinophils Relative: 1 %
HCT: 34.8 % — ABNORMAL LOW (ref 36.0–46.0)
Hemoglobin: 10.7 g/dL — ABNORMAL LOW (ref 12.0–15.0)
Immature Granulocytes: 1 %
Lymphocytes Relative: 38 %
Lymphs Abs: 2 K/uL (ref 0.7–4.0)
MCH: 21.9 pg — ABNORMAL LOW (ref 26.0–34.0)
MCHC: 30.7 g/dL (ref 30.0–36.0)
MCV: 71.2 fL — ABNORMAL LOW (ref 80.0–100.0)
Monocytes Absolute: 0.5 K/uL (ref 0.1–1.0)
Monocytes Relative: 10 %
Neutro Abs: 2.7 K/uL (ref 1.7–7.7)
Neutrophils Relative %: 50 %
Platelets: 332 K/uL (ref 150–400)
RBC: 4.89 MIL/uL (ref 3.87–5.11)
RDW: 17.8 % — ABNORMAL HIGH (ref 11.5–15.5)
WBC: 5.4 K/uL (ref 4.0–10.5)
nRBC: 0 % (ref 0.0–0.2)

## 2024-05-29 LAB — TYPE AND SCREEN
ABO/RH(D): O POS
Antibody Screen: NEGATIVE

## 2024-05-29 LAB — HCG, SERUM, QUALITATIVE: Preg, Serum: NEGATIVE

## 2024-05-29 MED ORDER — ONDANSETRON 4 MG PO TBDP: 4.0000 mg | ORAL_TABLET | Freq: Three times a day (TID) | ORAL | 0 refills | Status: DC | PRN

## 2024-05-29 MED ORDER — ONDANSETRON HCL 4 MG/2ML IJ SOLN: 4.0000 mg | Freq: Once | INTRAMUSCULAR | Status: AC

## 2024-05-29 MED ORDER — MORPHINE SULFATE (PF) 4 MG/ML IV SOLN
6.0000 mg | Freq: Once | INTRAVENOUS | Status: AC
  Administered 2024-05-29: 6 mg via INTRAVENOUS
  Filled 2024-05-29: qty 2

## 2024-05-29 MED ORDER — ONDANSETRON HCL 4 MG/2ML IJ SOLN
INTRAMUSCULAR | Status: AC
  Administered 2024-05-29: 4 mg via INTRAVENOUS
  Filled 2024-05-29: qty 2

## 2024-05-29 MED ORDER — KETOROLAC TROMETHAMINE 15 MG/ML IJ SOLN
15.0000 mg | Freq: Once | INTRAMUSCULAR | Status: AC
  Administered 2024-05-29: 15 mg via INTRAVENOUS
  Filled 2024-05-29: qty 1

## 2024-05-29 MED ORDER — IBUPROFEN 600 MG PO TABS: 600.0000 mg | ORAL_TABLET | Freq: Four times a day (QID) | ORAL | 0 refills | Status: DC | PRN

## 2024-05-29 MED ORDER — KETOROLAC TROMETHAMINE 30 MG/ML IJ SOLN
30.0000 mg | Freq: Once | INTRAMUSCULAR | Status: DC
Start: 2024-05-29 — End: 2024-05-29
  Filled 2024-05-29: qty 1

## 2024-05-29 NOTE — ED Triage Notes (Signed)
 Pt presents to ED from home C/O abdominal pain and vaginal bleeding. Reports she has been having vag bleeding since July. Going through 1 tampon + 2 pads per hour since Monday.

## 2024-05-29 NOTE — ED Provider Notes (Signed)
 [place] EMERGENCY DEPARTMENT AT [facility] Provider Note   CSN: [id number] Arrival date & time: 05/29/24  9266     Patient presents with: Abdominal Pain and Vaginal Bleeding   Katelyn Snow is a 43 y.o. female.   Patient comes with heavy vaginal bleeding and lower abdominal pain.  States this has been occurring since July, being seen by [employer].  Has been placed on OCPs and it has not helped.  States she is soaking through a pad every hour.  Has been taking Tylenol , ibuprofen  at home and this has not relieved her pain.  Denies nausea, vomiting, diarrhea, constipation.  States when she urinates or has a bowel movement she is just passing clots.  Endorses lightheadedness and dizziness.  Denies chest pain or shortness of breath.  Scheduled for a pelvic ultrasound on Monday.   Abdominal Pain Associated symptoms: vaginal bleeding   Vaginal Bleeding Associated symptoms: abdominal pain        Prior to Admission medications   Medication Sig Start Date End Date Taking? Authorizing Provider  cyclobenzaprine  (FLEXERIL ) 10 MG tablet Take 1 tablet (10 mg total) by mouth 2 (two) times daily as needed for muscle spasms. 03/18/24   Rising, [place], PA-C  medroxyPROGESTERone (PROVERA) 10 MG tablet Take 10 mg by mouth daily. 06/27/23   [provider]  metFORMIN  (GLUCOPHAGE -XR) 500 MG 24 hr tablet Take 2 tablets (1,000 mg total) by mouth 2 (two) times daily with a meal. 09/13/23 10/13/23  Dartha Ernst, MD    Allergies: Benzonatate, Lisinopril, and Norgestim-eth estrad triphasic    Review of Systems  Gastrointestinal:  Positive for abdominal pain.  Genitourinary:  Positive for vaginal bleeding.    Updated Vital Signs BP (!) 176/107 (BP Location: Right Arm)   Pulse 66   Temp 98.2 F (36.8 C) (Oral)   Resp 16   Ht 5' 2 (1.575 m)   Wt 97.5 kg   SpO2 99%   BMI 39.32 kg/m   Physical Exam  (all labs ordered are listed, but only abnormal results are  displayed) Labs Reviewed  CBC WITH DIFFERENTIAL/PLATELET  HCG, SERUM, QUALITATIVE    EKG: None  Radiology: No results found.   Procedures   Medications Ordered in the ED - No data to display                                  Medical Decision Making 43 year old female with history of PCOS presents with worsening heavy vaginal bleeding and abdominal pain.  Differential includes ectopic pregnancy, abortion, ovarian torsion, fibroids, neoplasm.  Symptoms have been present since July and patient has been started on OCPs but continues to have bleeding.  Hemodynamically stable with ongoing bleeding and pain.  Will give 1 dose morphine  and Zofran .  Labs significant for microcytic anemia to 10.7, otherwise WNL. Serum hCG negative.  Pelvic US  with doppler shows no ovarian torsion, 3.6 cm R ovarian cyst, and 5 mm fibroid.  Gave 15 mg IV Toradol  for ongoing pain.  Given benign findings on pelvic ultrasound, patient is stable for discharge.  Sent Rx for Zofran  and 600 mg ibuprofen  for patient to use as needed.  Should schedule close follow-up with her OB/GYN and PCP for ongoing workup.  Amount and/or Complexity of Data Reviewed Labs: ordered. Radiology: ordered.  Risk Prescription drug management.       Final diagnoses:  None    ED Discharge Orders  None          Adele Song, MD 05/29/24 1120    Dasie Faden, MD 05/30/24 [phone]

## 2024-05-29 NOTE — Discharge Instructions (Addendum)
 You were seen in the emergency room due to vaginal bleeding.  Testing showed mild anemia and a right ovarian cyst.  We treated you with pain medicine and nausea medicine, I sent in prescriptions for both of these they can use for the next few days.  You should follow-up with your OB/GYN and primary care doctor for ongoing workup.

## 2024-05-29 NOTE — ED Provider Notes (Signed)
 I saw and evaluated the patient, reviewed the resident's note and I agree with the findings and plan.      43 year old female presents with ongoing vaginal bleeding times several hours with uterine cramping.  Exam, chest pain suprapubic.  Patient had ultrasound for torsion as well as fortification etiology   Dasie Faden, MD 05/29/24 [phone]

## 2024-08-11 ENCOUNTER — Ambulatory Visit: Admitting: "Endocrinology

## 2024-08-29 ENCOUNTER — Ambulatory Visit (HOSPITAL_COMMUNITY): Payer: Self-pay

## 2024-08-29 ENCOUNTER — Ambulatory Visit
Admission: RE | Admit: 2024-08-29 | Discharge: 2024-08-29 | Disposition: A | Discharge status: Home / Self Care | Payer: Self-pay | Source: Ambulatory Visit | Attending: Family Medicine | Admitting: Family Medicine

## 2024-08-29 VITALS — BP 144/88 | HR 87 | Temp 97.9°F | Resp 16

## 2024-08-29 DIAGNOSIS — R070 Pain in throat: Secondary | ICD-10-CM

## 2024-08-29 DIAGNOSIS — B9789 Other viral agents as the cause of diseases classified elsewhere: Secondary | ICD-10-CM

## 2024-08-29 DIAGNOSIS — J988 Other specified respiratory disorders: Secondary | ICD-10-CM

## 2024-08-29 LAB — POCT RAPID STREP A (OFFICE): Rapid Strep A Screen: NEGATIVE

## 2024-08-29 MED ORDER — IBUPROFEN 600 MG PO TABS: 600.0000 mg | ORAL_TABLET | Freq: Four times a day (QID) | ORAL | 0 refills | Status: AC | PRN

## 2024-08-29 MED ORDER — PSEUDOEPHEDRINE HCL 30 MG PO TABS: 30.0000 mg | ORAL_TABLET | Freq: Three times a day (TID) | ORAL | 0 refills | Status: AC | PRN

## 2024-08-29 MED ORDER — CETIRIZINE HCL 10 MG PO TABS: 10.0000 mg | ORAL_TABLET | Freq: Every day | ORAL | 0 refills | Status: AC

## 2024-08-29 NOTE — ED Provider Notes (Signed)
 " [facility]  Note:  This document was prepared using Conservation officer, historic buildings and may include unintentional dictation errors.  MRN: [id number] DOB: 05-19-1981  Subjective:   Katelyn Snow is a 43 y.o. female presenting for 2-day history of right ear pain, fullness, throat pain and painful swallowing. Denies fever, ear drainage, congestion, coughing, chest pain, shob. No history of ear infections. No smoking of any kind including cigarettes, cigars, vaping, marijuana use.  Patient would like a strep test.  No sick exposures.  Current Outpatient Medications  Medication Instructions   ibuprofen  (ADVIL ) 600 mg, Oral, Every 6 hours PRN   medroxyPROGESTERone (PROVERA) 10 mg, Daily    Allergies[1]  Past Medical History:  Diagnosis Date   Blood transfusion without reported diagnosis    Headache(784.0)    migrains   Hypertension    Short cervix with cervical cerclage in second trimester, antepartum 01/25/2018     Past Surgical History:  Procedure Laterality Date   CERVICAL CERCLAGE N/A 01/25/2018   Procedure: CERCLAGE CERVICAL;  Surgeon: Danielle Rom, MD;  Location: [facility];  Service: Gynecology;  Laterality: N/A;   CESAREAN SECTION N/A 03/30/2018   Procedure: CESAREAN SECTION;  Surgeon: Estelle Service, MD;  Location: [facility];  Service: Obstetrics;  Laterality: N/A;   NO PAST SURGERIES      Family History  Problem Relation Age of Onset   Hypertension Mother    Hypertension Father    Hypertension Maternal Grandmother    Diabetes Maternal Grandmother     Social History   Occupational History   Not on file  Tobacco Use   Smoking status: Never   Smokeless tobacco: Never  Vaping Use   Vaping status: Never Used  Substance and Sexual Activity   Alcohol use: Not Currently    Alcohol/week: 1.0 standard drink of alcohol    Types: 1 Standard drinks or equivalent per week   Drug use: No   Sexual activity: Yes    Partners:  Male     ROS   Objective:   Vitals: BP (!) 144/88   Pulse 87   Temp 97.9 F (36.6 C) (Oral)   Resp 16   SpO2 97%   Physical Exam Constitutional:      General: She is not in acute distress.    Appearance: Normal appearance. She is well-developed and normal weight. She is not ill-appearing, toxic-appearing or diaphoretic.  HENT:     Head: Normocephalic and atraumatic.     Right Ear: Tympanic membrane, ear canal and external ear normal. No drainage or tenderness. No middle ear effusion. There is no impacted cerumen. Tympanic membrane is not injected, perforated, erythematous or bulging.     Left Ear: Tympanic membrane, ear canal and external ear normal. No drainage or tenderness.  No middle ear effusion. There is no impacted cerumen. Tympanic membrane is not injected, perforated, erythematous or bulging.     Nose: Nose normal. No congestion or rhinorrhea.     Mouth/Throat:     Mouth: Mucous membranes are moist. No oral lesions.     Pharynx: No pharyngeal swelling, oropharyngeal exudate, posterior oropharyngeal erythema or uvula swelling.     Tonsils: No tonsillar exudate or tonsillar abscesses. 0 on the right. 0 on the left.  Eyes:     General: No scleral icterus.       Right eye: No discharge.        Left eye: No discharge.     Extraocular Movements: Extraocular  movements intact.     Right eye: Normal extraocular motion.     Left eye: Normal extraocular motion.     Conjunctiva/sclera: Conjunctivae normal.  Cardiovascular:     Rate and Rhythm: Normal rate.  Pulmonary:     Effort: Pulmonary effort is normal.  Musculoskeletal:     Cervical back: Normal range of motion and neck supple.  Lymphadenopathy:     Cervical: No cervical adenopathy.  Skin:    General: Skin is warm and dry.  Neurological:     General: No focal deficit present.     Mental Status: She is alert and oriented to person, place, and time.  Psychiatric:        Mood and Affect: Mood normal.         Behavior: Behavior normal.     Results for orders placed or performed during the hospital encounter of 08/29/24 (from the past 24 hours)  POCT rapid strep A     Status: None   Collection Time: 08/29/24  9:33 AM  Result Value Ref Range   Rapid Strep A Screen Negative Negative    Assessment and Plan :   PDMP not reviewed this encounter.  1. Viral respiratory infection   2. Throat pain      Suspect viral URI, viral syndrome. Physical exam findings reassuring and vital signs stable for discharge. Advised supportive care, offered symptomatic relief. Counseled patient on potential for adverse effects with medications prescribed/recommended today, ER and return-to-clinic precautions discussed, patient verbalized understanding.      [1]  Allergies Allergen Reactions   Benzonatate Other (See Comments)    Other Reaction(s): not effective   Lisinopril Other (See Comments)    Other Reaction(s): not effective   Norgestim-Eth Estrad Triphasic     Other Reaction(s): breakthrough bleeding     Christopher Savannah, PA-C 08/29/24 [id number]  "

## 2024-08-29 NOTE — Discharge Instructions (Signed)
 We will manage this as a viral respiratory illness. For sore throat or cough try using a honey-based tea. Use 3 teaspoons of honey with juice squeezed from half lemon. Place shaved pieces of ginger into 1/2-1 cup of water and warm over stove top. Then mix the ingredients and repeat every 4 hours as needed. Please take ibuprofen  600mg  every 6 hours with food alternating with OR taken together with Tylenol  500mg -650mg  every 6 hours for throat pain, fevers, aches and pains. Hydrate very well with at least 2 liters of water. Eat light meals such as soups (chicken and noodles, vegetable, chicken and wild rice).  Do not eat foods that you are allergic to.  Taking an antihistamine like Zyrtec  (10mg  daily) can help against postnasal drainage, sinus congestion which can cause sinus pain, sinus headaches, throat pain, painful swallowing, coughing.  You can take this together with pseudoephedrine  (Sudafed) at a dose of 30mg  3 times a day or twice daily as needed for the same kind of nasal drip, congestion.

## 2024-08-29 NOTE — ED Triage Notes (Signed)
 Pt c/o of (R) ear pain for the past 2 days. Pt describes as achy, and reports that her throat is sore to touch and when she swallows. Pt denies any cough or fever. Patient has not tried any medications for symptoms.

## 2024-12-10 ENCOUNTER — Ambulatory Visit: Admitting: "Endocrinology
